# Patient Record
Sex: Male | Born: 1935 | Race: White | Hispanic: No | Marital: Married | Smoking: Former smoker
Health system: Southern US, Community
[De-identification: ages and names within clinical notes are randomized; demographics above are authoritative.]

## PROBLEM LIST (undated history)

## (undated) DIAGNOSIS — I499 Cardiac arrhythmia, unspecified: Secondary | ICD-10-CM

## (undated) DIAGNOSIS — J449 Chronic obstructive pulmonary disease, unspecified: Secondary | ICD-10-CM

## (undated) DIAGNOSIS — H401123 Primary open-angle glaucoma, left eye, severe stage: Secondary | ICD-10-CM

## (undated) DIAGNOSIS — I10 Essential (primary) hypertension: Secondary | ICD-10-CM

## (undated) DIAGNOSIS — M6281 Muscle weakness (generalized): Secondary | ICD-10-CM

## (undated) DIAGNOSIS — I2510 Atherosclerotic heart disease of native coronary artery without angina pectoris: Secondary | ICD-10-CM

## (undated) DIAGNOSIS — I4891 Unspecified atrial fibrillation: Secondary | ICD-10-CM

## (undated) DIAGNOSIS — E785 Hyperlipidemia, unspecified: Secondary | ICD-10-CM

## (undated) DIAGNOSIS — R296 Repeated falls: Secondary | ICD-10-CM

## (undated) DIAGNOSIS — H5440 Blindness, one eye, unspecified eye: Secondary | ICD-10-CM

## (undated) DIAGNOSIS — S7002XA Contusion of left hip, initial encounter: Secondary | ICD-10-CM

## (undated) HISTORY — PX: CHOLECYSTECTOMY: SHX55

## (undated) HISTORY — PX: CORONARY ANGIOPLASTY WITH STENT PLACEMENT: SHX49

## (undated) HISTORY — PX: CAROTID STENT: SHX1301

## (undated) HISTORY — PX: TRANSURETHRAL RESECTION OF PROSTATE: SHX73

---

## 2009-03-01 ENCOUNTER — Other Ambulatory Visit: Payer: Self-pay | Admitting: Internal Medicine

## 2015-05-03 ENCOUNTER — Encounter
Admission: RE | Admit: 2015-05-03 | Discharge: 2015-05-03 | Disposition: A | Discharge status: Home / Self Care | Payer: Medicare Other | Source: Ambulatory Visit | Attending: Internal Medicine | Admitting: Internal Medicine

## 2015-05-09 ENCOUNTER — Encounter
Admission: RE | Admit: 2015-05-09 | Discharge: 2015-05-09 | Disposition: A | Discharge status: Home / Self Care | Payer: Medicare Other | Source: Ambulatory Visit | Attending: Internal Medicine | Admitting: Internal Medicine

## 2015-05-29 ENCOUNTER — Emergency Department: Payer: Medicare Other

## 2015-05-29 ENCOUNTER — Observation Stay
Admission: EM | Admit: 2015-05-29 | Discharge: 2015-06-02 | Disposition: A | Discharge status: Skilled Nursing Facility | Payer: Medicare Other | Attending: Internal Medicine | Admitting: Internal Medicine

## 2015-05-29 DIAGNOSIS — N5082 Scrotal pain: Secondary | ICD-10-CM | POA: Diagnosis not present

## 2015-05-29 DIAGNOSIS — Z9889 Other specified postprocedural states: Secondary | ICD-10-CM | POA: Diagnosis not present

## 2015-05-29 DIAGNOSIS — R0602 Shortness of breath: Secondary | ICD-10-CM | POA: Insufficient documentation

## 2015-05-29 DIAGNOSIS — Z91041 Radiographic dye allergy status: Secondary | ICD-10-CM | POA: Diagnosis not present

## 2015-05-29 DIAGNOSIS — J90 Pleural effusion, not elsewhere classified: Secondary | ICD-10-CM | POA: Diagnosis not present

## 2015-05-29 DIAGNOSIS — J984 Other disorders of lung: Secondary | ICD-10-CM | POA: Insufficient documentation

## 2015-05-29 DIAGNOSIS — M25551 Pain in right hip: Secondary | ICD-10-CM | POA: Insufficient documentation

## 2015-05-29 DIAGNOSIS — K5909 Other constipation: Secondary | ICD-10-CM | POA: Insufficient documentation

## 2015-05-29 DIAGNOSIS — Z79899 Other long term (current) drug therapy: Secondary | ICD-10-CM | POA: Insufficient documentation

## 2015-05-29 DIAGNOSIS — I2510 Atherosclerotic heart disease of native coronary artery without angina pectoris: Secondary | ICD-10-CM | POA: Diagnosis not present

## 2015-05-29 DIAGNOSIS — Z7982 Long term (current) use of aspirin: Secondary | ICD-10-CM | POA: Insufficient documentation

## 2015-05-29 DIAGNOSIS — Z66 Do not resuscitate: Secondary | ICD-10-CM | POA: Diagnosis not present

## 2015-05-29 DIAGNOSIS — Z888 Allergy status to other drugs, medicaments and biological substances status: Secondary | ICD-10-CM | POA: Insufficient documentation

## 2015-05-29 DIAGNOSIS — J810 Acute pulmonary edema: Secondary | ICD-10-CM | POA: Diagnosis not present

## 2015-05-29 DIAGNOSIS — R161 Splenomegaly, not elsewhere classified: Secondary | ICD-10-CM | POA: Diagnosis not present

## 2015-05-29 DIAGNOSIS — I160 Hypertensive urgency: Secondary | ICD-10-CM | POA: Diagnosis present

## 2015-05-29 DIAGNOSIS — Z87891 Personal history of nicotine dependence: Secondary | ICD-10-CM | POA: Diagnosis not present

## 2015-05-29 DIAGNOSIS — M549 Dorsalgia, unspecified: Secondary | ICD-10-CM | POA: Diagnosis not present

## 2015-05-29 DIAGNOSIS — Z880 Allergy status to penicillin: Secondary | ICD-10-CM | POA: Insufficient documentation

## 2015-05-29 DIAGNOSIS — Z79891 Long term (current) use of opiate analgesic: Secondary | ICD-10-CM | POA: Diagnosis not present

## 2015-05-29 DIAGNOSIS — Z9049 Acquired absence of other specified parts of digestive tract: Secondary | ICD-10-CM | POA: Diagnosis not present

## 2015-05-29 DIAGNOSIS — I119 Hypertensive heart disease without heart failure: Secondary | ICD-10-CM | POA: Insufficient documentation

## 2015-05-29 DIAGNOSIS — Z885 Allergy status to narcotic agent status: Secondary | ICD-10-CM | POA: Diagnosis not present

## 2015-05-29 DIAGNOSIS — W19XXXA Unspecified fall, initial encounter: Secondary | ICD-10-CM | POA: Insufficient documentation

## 2015-05-29 DIAGNOSIS — Z8249 Family history of ischemic heart disease and other diseases of the circulatory system: Secondary | ICD-10-CM | POA: Diagnosis not present

## 2015-05-29 DIAGNOSIS — I517 Cardiomegaly: Secondary | ICD-10-CM | POA: Insufficient documentation

## 2015-05-29 DIAGNOSIS — G8929 Other chronic pain: Secondary | ICD-10-CM | POA: Insufficient documentation

## 2015-05-29 DIAGNOSIS — H5442 Blindness, left eye, normal vision right eye: Secondary | ICD-10-CM | POA: Insufficient documentation

## 2015-05-29 DIAGNOSIS — R918 Other nonspecific abnormal finding of lung field: Secondary | ICD-10-CM | POA: Diagnosis not present

## 2015-05-29 DIAGNOSIS — R0682 Tachypnea, not elsewhere classified: Secondary | ICD-10-CM | POA: Insufficient documentation

## 2015-05-29 DIAGNOSIS — E876 Hypokalemia: Secondary | ICD-10-CM | POA: Diagnosis not present

## 2015-05-29 DIAGNOSIS — R911 Solitary pulmonary nodule: Secondary | ICD-10-CM | POA: Diagnosis not present

## 2015-05-29 DIAGNOSIS — J449 Chronic obstructive pulmonary disease, unspecified: Secondary | ICD-10-CM | POA: Insufficient documentation

## 2015-05-29 DIAGNOSIS — Z955 Presence of coronary angioplasty implant and graft: Secondary | ICD-10-CM | POA: Diagnosis not present

## 2015-05-29 DIAGNOSIS — E785 Hyperlipidemia, unspecified: Secondary | ICD-10-CM | POA: Diagnosis not present

## 2015-05-29 DIAGNOSIS — R079 Chest pain, unspecified: Secondary | ICD-10-CM | POA: Diagnosis present

## 2015-05-29 DIAGNOSIS — H4010X3 Unspecified open-angle glaucoma, severe stage: Secondary | ICD-10-CM | POA: Insufficient documentation

## 2015-05-29 DIAGNOSIS — I482 Chronic atrial fibrillation: Secondary | ICD-10-CM | POA: Insufficient documentation

## 2015-05-29 HISTORY — DX: Repeated falls: R29.6

## 2015-05-29 HISTORY — DX: Primary open-angle glaucoma, left eye, severe stage: H40.1123

## 2015-05-29 HISTORY — DX: Contusion of left hip, initial encounter: S70.02XA

## 2015-05-29 HISTORY — DX: Unspecified atrial fibrillation: I48.91

## 2015-05-29 HISTORY — DX: Essential (primary) hypertension: I10

## 2015-05-29 HISTORY — DX: Hyperlipidemia, unspecified: E78.5

## 2015-05-29 HISTORY — DX: Blindness, one eye, unspecified eye: H54.40

## 2015-05-29 HISTORY — DX: Muscle weakness (generalized): M62.81

## 2015-05-29 HISTORY — DX: Atherosclerotic heart disease of native coronary artery without angina pectoris: I25.10

## 2015-05-29 HISTORY — DX: Chronic obstructive pulmonary disease, unspecified: J44.9

## 2015-05-29 LAB — CBC WITH DIFFERENTIAL/PLATELET
Basophils Absolute: 0.1 10*3/uL (ref 0–0.1)
Basophils Relative: 1 %
EOS ABS: 0.3 10*3/uL (ref 0–0.7)
EOS PCT: 3 %
HEMATOCRIT: 43.4 % (ref 40.0–52.0)
Hemoglobin: 13.9 g/dL (ref 13.0–18.0)
LYMPHS ABS: 1.5 10*3/uL (ref 1.0–3.6)
LYMPHS PCT: 16 %
MCH: 30.8 pg (ref 26.0–34.0)
MCHC: 32 g/dL (ref 32.0–36.0)
MCV: 96.5 fL (ref 80.0–100.0)
MONO ABS: 1.3 10*3/uL — AB (ref 0.2–1.0)
Monocytes Relative: 14 %
Neutro Abs: 6 10*3/uL (ref 1.4–6.5)
Neutrophils Relative %: 66 %
Platelets: 190 10*3/uL (ref 150–440)
RBC: 4.5 MIL/uL (ref 4.40–5.90)
RDW: 18.4 % — ABNORMAL HIGH (ref 11.5–14.5)
WBC: 9.1 10*3/uL (ref 3.8–10.6)

## 2015-05-29 LAB — BASIC METABOLIC PANEL
ANION GAP: 4 — AB (ref 5–15)
BUN: 26 mg/dL — ABNORMAL HIGH (ref 6–20)
CHLORIDE: 108 mmol/L (ref 101–111)
CO2: 30 mmol/L (ref 22–32)
Calcium: 10.1 mg/dL (ref 8.9–10.3)
Creatinine, Ser: 0.72 mg/dL (ref 0.61–1.24)
GFR calc non Af Amer: >60 mL/min (ref >60)
Glucose, Bld: 105 mg/dL — ABNORMAL HIGH (ref 65–99)
POTASSIUM: 4.1 mmol/L (ref 3.5–5.1)
Sodium: 142 mmol/L (ref 135–145)

## 2015-05-29 LAB — TROPONIN I

## 2015-05-29 LAB — BRAIN NATRIURETIC PEPTIDE: B NATRIURETIC PEPTIDE 5: 487 pg/mL — AB (ref 0.0–100.0)

## 2015-05-29 MED ORDER — NITROGLYCERIN 0.4 MG SL SUBL
0.4000 mg | SUBLINGUAL_TABLET | SUBLINGUAL | Status: DC | PRN
Start: 2015-05-29 — End: 2015-06-02
  Administered 2015-05-29 – 2015-06-01 (×2): 0.4 mg via SUBLINGUAL
  Filled 2015-05-29 (×2): qty 1

## 2015-05-29 MED ORDER — LABETALOL HCL 5 MG/ML IV SOLN: 20.0000 mg | Freq: Once | INTRAVENOUS | Status: DC

## 2015-05-29 MED ORDER — METOPROLOL TARTRATE 1 MG/ML IV SOLN
2.5000 mg | Freq: Once | INTRAVENOUS | Status: DC
  Filled 2015-05-29: qty 5

## 2015-05-29 MED ORDER — ASPIRIN 81 MG PO CHEW
324.0000 mg | CHEWABLE_TABLET | Freq: Once | ORAL | Status: AC
  Administered 2015-05-29: 324 mg via ORAL
  Filled 2015-05-29: qty 4

## 2015-05-29 NOTE — ED Notes (Signed)
Pt arrived to ED via [facility] from [facility] for c/o HTN. Per EMS, pt had c/o earlier this evening of SHOB and CP, but resolved after a breathing treatment. EMS reports being called out by staff for reports of HTN. EMS reports BP of 230/110 en route. Pt denies any c/o pain, N/V, or SHOB at this time.

## 2015-05-29 NOTE — ED Provider Notes (Signed)
[facility] Emergency Department Provider Note  ____________________________________________  Time seen: Seen upon arrival to the emergency department  I have reviewed the triage vital signs and the nursing notes.   HISTORY  Chief Complaint Hypertension    HPI Donnelly Legall is a 81 y.o. male with a history of hypertension, A. fib and COPD who is presenting today with high blood pressure. The patient denies any chest pain or shortness of breath at this time. However, per EMS, patient was complaining of shortness of breath and chest pain earlier in the evening. He was given a nebulizer treatment and his symptoms cleared. Per EMS there wheezing but otherwise the lung sounds were clear. The patient does not recall if he has taken his evening medications. Denies any fever, cough.   Past Medical History  Diagnosis Date  . Atrial fibrillation, unspecified   . Contusion of left hip   . Recurrent falls   . Muscle weakness (generalized)   . Atherosclerotic heart disease of native coronary artery without angina pectoris   . Hypertension   . Primary open angle glaucoma of left eye, severe stage   . Blindness of left eye   . COPD (chronic obstructive pulmonary disease) ([place])   . Hyperlipidemia     There are no active problems to display for this patient.   Past Surgical History  Procedure Laterality Date  . Cholecystectomy    . Transurethral resection of prostate    . Coronary angioplasty with stent placement    . Carotid stent      Current Outpatient Rx  Name  Route  Sig  Dispense  Refill  . aspirin EC 81 MG tablet   Oral   Take 81 mg by mouth daily.         . bimatoprost (LUMIGAN) 0.01 % SOLN   Both Eyes   Place 1 drop into both eyes at bedtime.         . brimonidine (ALPHAGAN) 0.2 % ophthalmic solution   Right Eye   Place 1 drop into the right eye 3 (three) times daily.         . cholecalciferol (VITAMIN D) 1000 UNITS tablet   Oral   Take  1,000 Units by mouth daily.         . cloNIDine (CATAPRES) 0.1 MG tablet   Oral   Take 0.1 mg by mouth once.         . dorzolamide-timolol (COSOPT) 22.3-6.8 MG/ML ophthalmic solution   Right Eye   Place 1 drop into the right eye 2 (two) times daily.         Marland Kitchen HYDROcodone-acetaminophen (NORCO/VICODIN) 5-325 MG tablet   Oral   Take 1 tablet by mouth every 4 (four) hours as needed for moderate pain.         Marland Kitchen lactulose (CHRONULAC) 10 GM/15ML solution   Oral   Take 20 g by mouth 2 (two) times daily.         Marland Kitchen lidocaine (LIDODERM) 5 %   Transdermal   Place 1 patch onto the skin daily. Remove & Discard patch within 12 hours or as directed by MD         . lisinopril (PRINIVIL,ZESTRIL) 20 MG tablet   Oral   Take 20 mg by mouth daily.         . Menthol-Methyl Salicylate (THERA-GESIC) 1-15 % CREA   Apply externally   Apply 1 application topically 4 (four) times daily as needed (for pain).         Marland Kitchen  metoprolol succinate (TOPROL-XL) 100 MG 24 hr tablet   Oral   Take 100 mg by mouth daily.         . simvastatin (ZOCOR) 20 MG tablet   Oral   Take 20 mg by mouth at bedtime.           Allergies Adenosine; Hydrochlorothiazide; Ivp dye; Penicillins; and Tramadol  History reviewed. No pertinent family history.  Social History Social History  Substance Use Topics  . Smoking status: Former Research scientist (life sciences)  . Smokeless tobacco: None  . Alcohol Use: 0.6 oz/week    1 Cans of beer per week    Review of Systems Constitutional: No fever/chills Eyes: No visual changes. ENT: No sore throat. Cardiovascular: Denies chest pain. Respiratory: Denies shortness of breath. Gastrointestinal: No abdominal pain.  No nausea, no vomiting.  No diarrhea.  No constipation. Genitourinary: Negative for dysuria. Musculoskeletal: Negative for back pain. Skin: Negative for rash. Neurological: Negative for headaches, focal weakness or numbness.  10-point ROS otherwise  negative.  ____________________________________________   PHYSICAL EXAM:  VITAL SIGNS: ED Triage Vitals  Enc Vitals Group     BP 05/29/15 2138 191/127 mmHg     Pulse Rate 05/29/15 2138 81     Resp --      Temp 05/29/15 2138 97.3 F (36.3 C)     Temp Source 05/29/15 2138 Oral     SpO2 05/29/15 2138 95 %     Weight 05/29/15 2138 172 lb 12.8 oz (78.382 kg)     Height 05/29/15 2138 5\' 10"  (1.778 m)     Head Cir --      Peak Flow --      Pain Score 05/29/15 2140 0     Pain Loc --      Pain Edu? --      Excl. in [place]? --     Constitutional: Alert and oriented. Well appearing and in no acute distress. Eyes: Conjunctivae are normal. PERRL. EOMI. Head: Atraumatic. Nose: No congestion/rhinnorhea. Mouth/Throat: Mucous membranes are moist.  Oropharynx non-erythematous. Neck: No stridor.   Cardiovascular: Normal rate, regular rhythm. Grossly normal heart sounds.  Good peripheral circulation. Respiratory: Mildly increased respiratory effort with mild tachypnea. No retractions. Lungs CTAB. Gastrointestinal: Soft and nontender. No distention. No abdominal bruits. No CVA tenderness. Musculoskeletal: No lower extremity tenderness. Mild edema to the bilateral lower extremities.  No joint effusions. Neurologic:  Normal speech and language. No gross focal neurologic deficits are appreciated. No gait instability. Skin:  Skin is warm, dry and intact. No rash noted. Psychiatric: Mood and affect are normal. Speech and behavior are normal.  ____________________________________________   LABS (all labs ordered are listed, but only abnormal results are displayed)  Labs Reviewed  CBC WITH DIFFERENTIAL/PLATELET - Abnormal; Notable for the following:    RDW 18.4 (*)    Monocytes Absolute 1.3 (*)    All other components within normal limits  BASIC METABOLIC PANEL - Abnormal; Notable for the following:    Glucose, Bld 105 (*)    BUN 26 (*)    Anion gap 4 (*)    All other components within normal  limits  BRAIN NATRIURETIC PEPTIDE - Abnormal; Notable for the following:    B Natriuretic Peptide 487.0 (*)    All other components within normal limits  TROPONIN I   ____________________________________________  EKG  ED ECG REPORT I, Doran Stabler, the attending physician, personally viewed and interpreted this ECG.   Date: 05/29/2015  EKG Time: 2142  Rate:  102  Rhythm: atrial fibrillation, rate 102  Axis: Normal axis  Intervals:none  ST&T Change: No ST segment elevation or depression. No abnormal T-wave inversion.  ____________________________________________  RADIOLOGY  2.3 x 1.67 and a right upper lobe nodule concerning for lung cancer. ____________________________________________   PROCEDURES    ____________________________________________   INITIAL IMPRESSION / ASSESSMENT AND PLAN / ED COURSE  Pertinent labs & imaging results that were available during my care of the patient were reviewed by me and considered in my medical decision making (see chart for details).  ----------------------------------------- 11:15 PM on 05/29/2015 -----------------------------------------  Patient resting comfortable now and respiratory status improved after nitroglycerin. I will admit the patient to the hospital secondary to his chest pain with hypertension. Explained this to the patient as well as the family who understand the plan and are willing to comply. They're also aware of the possible mass on the chest x-ray. We will proceed with CAT scan. ____________________________________________   FINAL CLINICAL IMPRESSION(S) / ED DIAGNOSES  Hypertensive urgency.    Orbie Pyo, MD 05/29/15 [id number]

## 2015-05-29 NOTE — ED Notes (Signed)
Patient transported from CT to ED [id number].

## 2015-05-30 ENCOUNTER — Observation Stay: Payer: Medicare Other

## 2015-05-30 ENCOUNTER — Encounter: Payer: Self-pay | Admitting: Internal Medicine

## 2015-05-30 DIAGNOSIS — I160 Hypertensive urgency: Secondary | ICD-10-CM | POA: Diagnosis not present

## 2015-05-30 DIAGNOSIS — R079 Chest pain, unspecified: Secondary | ICD-10-CM | POA: Diagnosis present

## 2015-05-30 LAB — PROTEIN, BODY FLUID: Total protein, fluid: <3 g/dL

## 2015-05-30 LAB — URINALYSIS COMPLETE WITH MICROSCOPIC (ARMC ONLY)
BACTERIA UA: NONE SEEN
Bilirubin Urine: NEGATIVE
Glucose, UA: NEGATIVE mg/dL
Hgb urine dipstick: NEGATIVE
KETONES UR: NEGATIVE mg/dL
Leukocytes, UA: NEGATIVE
NITRITE: NEGATIVE
PH: 5 (ref 5.0–8.0)
PROTEIN: 30 mg/dL — AB
Specific Gravity, Urine: 1.019 (ref 1.005–1.030)

## 2015-05-30 LAB — TSH: TSH: 1.035 u[IU]/mL (ref 0.350–4.500)

## 2015-05-30 LAB — TROPONIN I
Troponin I: <0.03 ng/mL (ref <0.031)
Troponin I: <0.03 ng/mL (ref <0.031)

## 2015-05-30 LAB — LACTATE DEHYDROGENASE, PLEURAL OR PERITONEAL FLUID: LD FL: 102 U/L — AB (ref 3–23)

## 2015-05-30 LAB — AMYLASE: Amylase: 44 U/L (ref 28–100)

## 2015-05-30 LAB — HEMOGLOBIN A1C: Hgb A1c MFr Bld: 4.8 % (ref 4.0–6.0)

## 2015-05-30 MED ORDER — SIMVASTATIN 20 MG PO TABS
20.0000 mg | ORAL_TABLET | Freq: Every day | ORAL | Status: DC
  Administered 2015-05-30 – 2015-05-31 (×2): 20 mg via ORAL
  Filled 2015-05-30 (×3): qty 1

## 2015-05-30 MED ORDER — ACETAMINOPHEN 650 MG RE SUPP: 650.0000 mg | Freq: Four times a day (QID) | RECTAL | Status: DC | PRN

## 2015-05-30 MED ORDER — LIDOCAINE 5 % EX PTCH
1.0000 | MEDICATED_PATCH | CUTANEOUS | Status: DC
  Administered 2015-05-30 – 2015-06-02 (×4): 1 via TRANSDERMAL
  Filled 2015-05-30 (×5): qty 1

## 2015-05-30 MED ORDER — SODIUM CHLORIDE 0.9 % IJ SOLN: 3.0000 mL | Freq: Two times a day (BID) | INTRAMUSCULAR | Status: DC

## 2015-05-30 MED ORDER — ACETAMINOPHEN 325 MG PO TABS: 650.0000 mg | ORAL_TABLET | Freq: Four times a day (QID) | ORAL | Status: DC | PRN

## 2015-05-30 MED ORDER — VITAMIN D 1000 UNITS PO TABS
1000.0000 [IU] | ORAL_TABLET | Freq: Every day | ORAL | Status: DC
  Administered 2015-05-30 – 2015-06-02 (×4): 1000 [IU] via ORAL
  Filled 2015-05-30 (×4): qty 1

## 2015-05-30 MED ORDER — ASPIRIN EC 81 MG PO TBEC
81.0000 mg | DELAYED_RELEASE_TABLET | Freq: Every day | ORAL | Status: DC
  Administered 2015-05-30 – 2015-06-02 (×4): 81 mg via ORAL
  Filled 2015-05-30 (×4): qty 1

## 2015-05-30 MED ORDER — MORPHINE SULFATE (PF) 2 MG/ML IV SOLN: 2.0000 mg | INTRAVENOUS | Status: DC | PRN

## 2015-05-30 MED ORDER — FUROSEMIDE 10 MG/ML IJ SOLN
40.0000 mg | Freq: Once | INTRAMUSCULAR | Status: AC
  Administered 2015-05-30: 40 mg via INTRAVENOUS
  Filled 2015-05-30: qty 4

## 2015-05-30 MED ORDER — METOPROLOL SUCCINATE ER 100 MG PO TB24
100.0000 mg | ORAL_TABLET | Freq: Every day | ORAL | Status: DC
  Administered 2015-05-30 – 2015-06-02 (×4): 100 mg via ORAL
  Filled 2015-05-30 (×4): qty 1

## 2015-05-30 MED ORDER — HEPARIN SODIUM (PORCINE) 5000 UNIT/ML IJ SOLN
5000.0000 [IU] | Freq: Three times a day (TID) | INTRAMUSCULAR | Status: DC
  Administered 2015-05-30: 5000 [IU] via SUBCUTANEOUS
  Filled 2015-05-30: qty 1

## 2015-05-30 MED ORDER — CLONIDINE HCL 0.1 MG PO TABS
0.1000 mg | ORAL_TABLET | Freq: Once | ORAL | Status: AC
  Administered 2015-05-30: 0.1 mg via ORAL
  Filled 2015-05-30: qty 1

## 2015-05-30 MED ORDER — DORZOLAMIDE HCL-TIMOLOL MAL 2-0.5 % OP SOLN
1.0000 [drp] | Freq: Two times a day (BID) | OPHTHALMIC | Status: DC
  Administered 2015-05-30 – 2015-06-02 (×6): 1 [drp] via OPHTHALMIC
  Filled 2015-05-30: qty 10

## 2015-05-30 MED ORDER — BRIMONIDINE TARTRATE 0.2 % OP SOLN
1.0000 [drp] | Freq: Three times a day (TID) | OPHTHALMIC | Status: DC
  Administered 2015-05-30 – 2015-06-02 (×8): 1 [drp] via OPHTHALMIC
  Filled 2015-05-30: qty 5

## 2015-05-30 MED ORDER — MUSCLE RUB 10-15 % EX CREA
1.0000 "application " | TOPICAL_CREAM | Freq: Four times a day (QID) | CUTANEOUS | Status: DC | PRN
  Filled 2015-05-30: qty 85

## 2015-05-30 MED ORDER — ISOSORBIDE MONONITRATE ER 30 MG PO TB24
30.0000 mg | ORAL_TABLET | Freq: Every day | ORAL | Status: DC
  Administered 2015-05-30 – 2015-06-02 (×5): 30 mg via ORAL
  Filled 2015-05-30 (×6): qty 1

## 2015-05-30 MED ORDER — ONDANSETRON HCL 4 MG PO TABS: 4.0000 mg | ORAL_TABLET | Freq: Four times a day (QID) | ORAL | Status: DC | PRN

## 2015-05-30 MED ORDER — DILTIAZEM HCL 25 MG/5ML IV SOLN
10.0000 mg | Freq: Once | INTRAVENOUS | Status: AC
  Administered 2015-05-30: 10 mg via INTRAVENOUS
  Filled 2015-05-30: qty 5

## 2015-05-30 MED ORDER — LACTULOSE 10 GM/15ML PO SOLN
20.0000 g | Freq: Two times a day (BID) | ORAL | Status: DC
  Administered 2015-05-30 – 2015-06-02 (×6): 20 g via ORAL
  Filled 2015-05-30 (×7): qty 30

## 2015-05-30 MED ORDER — LATANOPROST 0.005 % OP SOLN
1.0000 [drp] | Freq: Every day | OPHTHALMIC | Status: DC
Start: 2015-05-30 — End: 2015-06-02
  Administered 2015-05-30 – 2015-05-31 (×2): 1 [drp] via OPHTHALMIC
  Filled 2015-05-30: qty 2.5

## 2015-05-30 MED ORDER — LISINOPRIL 20 MG PO TABS
20.0000 mg | ORAL_TABLET | Freq: Every day | ORAL | Status: DC
  Administered 2015-05-30 – 2015-06-02 (×4): 20 mg via ORAL
  Filled 2015-05-30 (×3): qty 1

## 2015-05-30 MED ORDER — SODIUM CHLORIDE 0.9 % IJ SOLN: 3.0000 mL | INTRAMUSCULAR | Status: DC | PRN

## 2015-05-30 MED ORDER — ONDANSETRON HCL 4 MG/2ML IJ SOLN: 4.0000 mg | Freq: Four times a day (QID) | INTRAMUSCULAR | Status: DC | PRN

## 2015-05-30 MED ORDER — LISINOPRIL 20 MG PO TABS
20.0000 mg | ORAL_TABLET | Freq: Every day | ORAL | Status: DC
Start: 2015-05-30 — End: 2015-05-30

## 2015-05-30 MED ORDER — DOCUSATE SODIUM 100 MG PO CAPS
100.0000 mg | ORAL_CAPSULE | Freq: Two times a day (BID) | ORAL | Status: DC
  Administered 2015-05-30 – 2015-06-02 (×6): 100 mg via ORAL
  Filled 2015-05-30 (×7): qty 1

## 2015-05-30 MED ORDER — LISINOPRIL 20 MG PO TABS
10.0000 mg | ORAL_TABLET | Freq: Once | ORAL | Status: AC
Start: 2015-05-30 — End: 2015-05-30
  Administered 2015-05-30: 10 mg via ORAL
  Filled 2015-05-30: qty 1

## 2015-05-30 NOTE — Procedures (Signed)
R thora 1000 cc CXR pending

## 2015-05-30 NOTE — Consult Note (Signed)
[facility] Cardiology  CARDIOLOGY CONSULT NOTE  Patient ID: Mason Allen MRN: [id number] DOB/AGE: Jan 08, 1921 81 y.o.  Admit date: 05/29/2015 Referring Physician Volanda Napoleon Primary Physician [facility] Primary Cardiologist Milind Raether Reason for Consultation hypertension  HPI: 71 year old gentleman referred for evaluation of elevated blood pressure, shortness of breath and atrial fibrillation. The patient has known coronary artery disease, status post stent RCA 04/21/06, stent mid LAD 08/23/06. The patient has a history of paroxysmal atrial fibrillation and essential hypertension. He presents to [facility] emergency room with progressive exertional dyspnea. Chest x-ray revealed right upper lobe lung nodule with bilateral pleural effusions. She was noted be hypertensive with systolic blood pressures greater than 190. The patient denies chest pain. EKG was nondiagnostic. Troponin is negative. Thoracentesis pending this afternoon.  Review of systems complete and found to be negative unless listed above     Past Medical History  Diagnosis Date  . Atrial fibrillation, unspecified   . Contusion of left hip   . Recurrent falls   . Muscle weakness (generalized)   . Atherosclerotic heart disease of native coronary artery without angina pectoris   . Hypertension   . Primary open angle glaucoma of left eye, severe stage   . Blindness of left eye   . COPD (chronic obstructive pulmonary disease) ([place])   . Hyperlipidemia     Past Surgical History  Procedure Laterality Date  . Cholecystectomy    . Transurethral resection of prostate    . Coronary angioplasty with stent placement    . Carotid stent      Prescriptions prior to admission  Medication Sig Dispense Refill Last Dose  . aspirin EC 81 MG tablet Take 81 mg by mouth daily.   unknown at unknown  . bimatoprost (LUMIGAN) 0.01 % SOLN Place 1 drop into both eyes at bedtime.   unknown at unknown  . brimonidine (ALPHAGAN) 0.2 % ophthalmic solution Place 1 drop  into the right eye 3 (three) times daily.   unknown at unknown  . cholecalciferol (VITAMIN D) 1000 UNITS tablet Take 1,000 Units by mouth daily.   unknown at unknown  . cloNIDine (CATAPRES) 0.1 MG tablet Take 0.1 mg by mouth once.   05/29/2015 at 1230  . dorzolamide-timolol (COSOPT) 22.3-6.8 MG/ML ophthalmic solution Place 1 drop into the right eye 2 (two) times daily.   unknown at unknown  . HYDROcodone-acetaminophen (NORCO/VICODIN) 5-325 MG tablet Take 1 tablet by mouth every 4 (four) hours as needed for moderate pain.   PRN at PRN  . lactulose (CHRONULAC) 10 GM/15ML solution Take 20 g by mouth 2 (two) times daily.   unknown at unknown  . lidocaine (LIDODERM) 5 % Place 1 patch onto the skin daily. Remove & Discard patch within 12 hours or as directed by MD   unknown at unknown  . lisinopril (PRINIVIL,ZESTRIL) 20 MG tablet Take 20 mg by mouth daily.   unknown at unknown  . Menthol-Methyl Salicylate (THERA-GESIC) 1-15 % CREA Apply 1 application topically 4 (four) times daily as needed (for pain).   PRN at PRN  . metoprolol succinate (TOPROL-XL) 100 MG 24 hr tablet Take 100 mg by mouth daily.   unknown at unknown  . simvastatin (ZOCOR) 20 MG tablet Take 20 mg by mouth at bedtime.   unknown at unknown   Social History   Social History  . Marital Status: Married    Spouse Name: N/A  . Number of Children: N/A  . Years of Education: N/A   Occupational History  . Not on file.  Social History Main Topics  . Smoking status: Former Research scientist (life sciences)  . Smokeless tobacco: Not on file  . Alcohol Use: 0.6 oz/week    1 Cans of beer per week  . Drug Use: Not on file  . Sexual Activity: Not on file   Other Topics Concern  . Not on file   Social History Narrative    Family History  Problem Relation Age of Onset  . Hypertension        Review of systems complete and found to be negative unless listed above      PHYSICAL EXAM  General: Well developed, well nourished, in no acute distress HEENT:   Normocephalic and atramatic Neck:  No JVD.  Lungs: Clear bilaterally to auscultation and percussion. Heart: HRRR . Normal S1 and S2 without gallops or murmurs.  Abdomen: Bowel sounds are positive, abdomen soft and non-tender  Msk:  Back normal, normal gait. Normal strength and tone for age. Extremities: No clubbing, cyanosis or edema.   Neuro: Alert and oriented X 3. Psych:  Good affect, responds appropriately  Labs:   Lab Results  Component Value Date   WBC 9.1 05/29/2015   HGB 13.9 05/29/2015   HCT 43.4 05/29/2015   MCV 96.5 05/29/2015   PLT 190 05/29/2015    Recent Labs Lab 05/29/15 2152  NA 142  K 4.1  CL 108  CO2 30  BUN 26*  CREATININE 0.72  CALCIUM 10.1  GLUCOSE 105*   Lab Results  Component Value Date   TROPONINI <0.03 05/30/2015   No results found for: CHOL No results found for: HDL No results found for: LDLCALC No results found for: TRIG No results found for: CHOLHDL No results found for: LDLDIRECT    Radiology: Dg Chest 1 View  05/29/2015  CLINICAL DATA:  Shortness of breath. Chest pain. Hypertension. Symptoms occurred tonight. EXAM: CHEST 1 VIEW COMPARISON:  None. FINDINGS: Atherosclerotic aortic arch. Borderline enlargement of the cardiopericardial silhouette mild obscuration of the left hemidiaphragm medially in the retrocardiac region. There is some calcifications in the right mid lung and right hilum indicative of old granulomatous disease. However, there is also a somewhat irregular 2.3 by 1.6 cm right upper lobe nodule which is not definitively calcified. The Mild upper zone pulmonary vascular prominence. IMPRESSION: 1. 2.3 by 1.6 cm right upper lobe nodule. The possibility of lung cancer is raised. Chest CT is recommended for further workup. If the patient's creatinine allows, pulmonary embolus protocol chest CTA could be utilized but to workup this nodule and rule out underlying vascular abnormalities. 2. Native retrocardiac density possibly from  atelectasis or pneumonia. 3. Old granulomatous disease 4. Atherosclerotic aortic arch. Electronically Signed   By: Van Clines M.D.   On: 05/29/2015 22:11   Ct Chest Wo Contrast  05/29/2015  CLINICAL DATA:  Acute onset of shortness of breath and generalized chest pain. Wheezing. High blood pressure. Initial encounter. EXAM: CT CHEST WITHOUT CONTRAST TECHNIQUE: Multidetector CT imaging of the chest was performed following the standard protocol without IV contrast. COMPARISON:  Chest radiograph performed earlier today at 9:50 p.m. FINDINGS: Small to moderate bilateral pleural effusions are noted, slightly larger on the right. Scattered calcified granulomata are noted within the collapsed portions of the right lower lobe. A 2.1 cm nodule is noted near the right lung apex, concerning for primary malignancy. A prominent calcified granuloma is noted within the right middle lobe. No pneumothorax is seen. Trace pericardial fluid remains within normal limits. Diffuse coronary artery calcifications are seen.  Diffuse calcification is noted along the aortic arch and proximal great vessels. The thyroid gland is unremarkable in appearance. No axillary lymphadenopathy is seen. Small bilateral hypodensities in the liver are nonspecific but may reflect small cysts. Numerous calcified granulomata are seen within the spleen. Diffuse calcification is noted along the proximal abdominal aorta and its branches. There appears to be a 3.3 cm solid mass inferior to the splenic hilum, which may reflect a renal mass, though the left kidney is not imaged on this study. No acute osseous abnormalities are seen. IMPRESSION: 1. Small to moderate bilateral pleural effusions, slightly larger on the right. Underlying scattered calcified granulomata within the collapsed portions of the right lower lobe. 2. 2.1 cm nodule near the right lung apex, concerning for primary bronchogenic malignancy. 3. Apparent 3.3 cm solid mass inferior to the  splenic hilum. The left kidney is not imaged on this study, but this may reflect a renal mass. CT of the abdomen and pelvis could be considered for further evaluation, on an elective nonemergent basis, if deemed clinically appropriate. 4. Diffuse coronary artery calcifications seen. 5. Scattered small bilateral hypodensities within the liver are nonspecific but may reflect small cysts. 6. Diffuse calcification along the proximal abdominal aorta and its branches. Electronically Signed   By: Garald Balding M.D.   On: 05/29/2015 23:57    EKG: Normal sinus rhythm  ASSESSMENT AND PLAN:   1. Shortness of breath, likely multifactorial, no COPD, bilateral pleural effusions, centesis pending 2. Hypertensive urgency, blood pressure improved after intravenous diltiazem, currently clinically stable, may benefit from the addition of diuretic to blood pressure regimen 3. Known CAD, currently without chest pain, negative troponin 4. Chronic atrial fibrillation, chads Vasc of 4, currently on aspirin with recent history of hematoma  Recommendations  1. Agree with overall current therapy 2. Add low-dose furosemide to blood pressure regimen 3. Continue aspirin for stroke prevention 4. Defer further cardiac diagnostics at this time   Signed: Mariaceleste Herrera MD,PhD, [facility] 05/30/2015, 1:19 PM

## 2015-05-30 NOTE — ED Notes (Signed)
MD at bedside. 

## 2015-05-30 NOTE — H&P (Signed)
Mason Allen is an 81 y.o. male.   Chief Complaint: Shortness of breath HPI: The patient presents emergency department via EMS after experiencing rapidly worsening shortness of breath. The patient states that he had been mildly dyspneic the previous 2 days but got to the point that he could not leave today. He also admits to some chest pain while he was acutely short of breath. His chest pain resolved after receiving aspirin and nitroglycerin in the emergency department, however he continued to breathe rapidly. Chest x-ray shows bilateral pleural effusions as well as a right upper lobe lung nodule. Systolic blood pressure was found to be greater than 190. History of falls and recently had his lisinopril dose decreased. CT scan of the abdomen shows a splenic mass as well. Of note, the patient also complains of left scrotal pain. He underwent an ultrasound of his scrotum yesterday which his wife reports shows hydrocele without inguinal hernia. Past medical history significant for coronary artery disease status post PCI, hypertension and transurethral resection of the prostate. The emergency department called for admission due to uncontrolled blood pressure and tachypnea.  Past Medical History  Diagnosis Date  . Atrial fibrillation, unspecified   . Contusion of left hip   . Recurrent falls   . Muscle weakness (generalized)   . Atherosclerotic heart disease of native coronary artery without angina pectoris   . Hypertension   . Primary open angle glaucoma of left eye, severe stage   . Blindness of left eye   . COPD (chronic obstructive pulmonary disease) ([place])   . Hyperlipidemia     Past Surgical History  Procedure Laterality Date  . Cholecystectomy    . Transurethral resection of prostate    . Coronary angioplasty with stent placement    . Carotid stent      Family History  Problem Relation Age of Onset  . Hypertension     Social History:  reports that he has quit smoking. He does not have  any smokeless tobacco history on file. He reports that he drinks about 0.6 oz of alcohol per week. His drug history is not on file.  Allergies:  Allergies  Allergen Reactions  . Adenosine Other (See Comments)    Reaction:  Unknown   . Hydrochlorothiazide Other (See Comments)    Reaction:  Unknown   . Ivp Dye [Iodinated Diagnostic Agents] Other (See Comments)    Reaction:  Unknown   . Penicillins Other (See Comments)    Reaction:  Unknown   . Tramadol Other (See Comments)    Reaction:  Unknown     Prior to Admission medications   Medication Sig Start Date End Date Taking? Authorizing Provider  aspirin EC 81 MG tablet Take 81 mg by mouth daily.   Yes Historical Provider, MD  bimatoprost (LUMIGAN) 0.01 % SOLN Place 1 drop into both eyes at bedtime.   Yes Historical Provider, MD  brimonidine (ALPHAGAN) 0.2 % ophthalmic solution Place 1 drop into the right eye 3 (three) times daily.   Yes Historical Provider, MD  cholecalciferol (VITAMIN D) 1000 UNITS tablet Take 1,000 Units by mouth daily.   Yes Historical Provider, MD  cloNIDine (CATAPRES) 0.1 MG tablet Take 0.1 mg by mouth once.   Yes Historical Provider, MD  dorzolamide-timolol (COSOPT) 22.3-6.8 MG/ML ophthalmic solution Place 1 drop into the right eye 2 (two) times daily.   Yes Historical Provider, MD  HYDROcodone-acetaminophen (NORCO/VICODIN) 5-325 MG tablet Take 1 tablet by mouth every 4 (four) hours as needed for  moderate pain.   Yes Historical Provider, MD  lactulose (CHRONULAC) 10 GM/15ML solution Take 20 g by mouth 2 (two) times daily.   Yes Historical Provider, MD  lidocaine (LIDODERM) 5 % Place 1 patch onto the skin daily. Remove & Discard patch within 12 hours or as directed by MD   Yes Historical Provider, MD  lisinopril (PRINIVIL,ZESTRIL) 20 MG tablet Take 20 mg by mouth daily.   Yes Historical Provider, MD  Menthol-Methyl Salicylate (THERA-GESIC) 1-15 % CREA Apply 1 application topically 4 (four) times daily as needed (for  pain).   Yes Historical Provider, MD  metoprolol succinate (TOPROL-XL) 100 MG 24 hr tablet Take 100 mg by mouth daily.   Yes Historical Provider, MD  simvastatin (ZOCOR) 20 MG tablet Take 20 mg by mouth at bedtime.   Yes Historical Provider, MD     Results for orders placed or performed during the hospital encounter of 05/29/15 (from the past 48 hour(s))  CBC with Differential     Status: Abnormal   Collection Time: 05/29/15  9:52 PM  Result Value Ref Range   WBC 9.1 3.8 - 10.6 K/uL   RBC 4.50 4.40 - 5.90 MIL/uL   Hemoglobin 13.9 13.0 - 18.0 g/dL   HCT 43.4 40.0 - 52.0 %   MCV 96.5 80.0 - 100.0 fL   MCH 30.8 26.0 - 34.0 pg   MCHC 32.0 32.0 - 36.0 g/dL   RDW 18.4 (H) 11.5 - 14.5 %   Platelets 190 150 - 440 K/uL   Neutrophils Relative % 66 %   Neutro Abs 6.0 1.4 - 6.5 K/uL   Lymphocytes Relative 16 %   Lymphs Abs 1.5 1.0 - 3.6 K/uL   Monocytes Relative 14 %   Monocytes Absolute 1.3 (H) 0.2 - 1.0 K/uL   Eosinophils Relative 3 %   Eosinophils Absolute 0.3 0 - 0.7 K/uL   Basophils Relative 1 %   Basophils Absolute 0.1 0 - 0.1 K/uL  Basic metabolic panel     Status: Abnormal   Collection Time: 05/29/15  9:52 PM  Result Value Ref Range   Sodium 142 135 - 145 mmol/L   Potassium 4.1 3.5 - 5.1 mmol/L   Chloride 108 101 - 111 mmol/L   CO2 30 22 - 32 mmol/L   Glucose, Bld 105 (H) 65 - 99 mg/dL   BUN 26 (H) 6 - 20 mg/dL   Creatinine, Ser 0.72 0.61 - 1.24 mg/dL   Calcium 10.1 8.9 - 10.3 mg/dL   GFR calc non Af Amer >60 >60 mL/min   GFR calc Af Amer >60 >60 mL/min    Comment: (NOTE) The eGFR has been calculated using the CKD EPI equation. This calculation has not been validated in all clinical situations. eGFR's persistently <60 mL/min signify possible Chronic Kidney Disease.    Anion gap 4 (L) 5 - 15  Troponin I     Status: None   Collection Time: 05/29/15  9:52 PM  Result Value Ref Range   Troponin I <0.03 <0.031 ng/mL    Comment:        NO INDICATION OF MYOCARDIAL INJURY.    Brain natriuretic peptide     Status: Abnormal   Collection Time: 05/29/15  9:52 PM  Result Value Ref Range   B Natriuretic Peptide 487.0 (H) 0.0 - 100.0 pg/mL  Urinalysis complete, with microscopic ([facility] only)     Status: Abnormal   Collection Time: 05/29/15 11:53 PM  Result Value Ref Range   Color,  Urine YELLOW (A) YELLOW   APPearance CLEAR (A) CLEAR   Glucose, UA NEGATIVE NEGATIVE mg/dL   Bilirubin Urine NEGATIVE NEGATIVE   Ketones, ur NEGATIVE NEGATIVE mg/dL   Specific Gravity, Urine 1.019 1.005 - 1.030   Hgb urine dipstick NEGATIVE NEGATIVE   pH 5.0 5.0 - 8.0   Protein, ur 30 (A) NEGATIVE mg/dL   Nitrite NEGATIVE NEGATIVE   Leukocytes, UA NEGATIVE NEGATIVE   RBC / HPF 0-5 0 - 5 RBC/hpf   WBC, UA 0-5 0 - 5 WBC/hpf   Bacteria, UA NONE SEEN NONE SEEN   Squamous Epithelial / LPF 0-5 (A) NONE SEEN   Mucous PRESENT    Hyaline Casts, UA PRESENT    Dg Chest 1 View  05/29/2015  CLINICAL DATA:  Shortness of breath. Chest pain. Hypertension. Symptoms occurred tonight. EXAM: CHEST 1 VIEW COMPARISON:  None. FINDINGS: Atherosclerotic aortic arch. Borderline enlargement of the cardiopericardial silhouette mild obscuration of the left hemidiaphragm medially in the retrocardiac region. There is some calcifications in the right mid lung and right hilum indicative of old granulomatous disease. However, there is also a somewhat irregular 2.3 by 1.6 cm right upper lobe nodule which is not definitively calcified. The Mild upper zone pulmonary vascular prominence. IMPRESSION: 1. 2.3 by 1.6 cm right upper lobe nodule. The possibility of lung cancer is raised. Chest CT is recommended for further workup. If the patient's creatinine allows, pulmonary embolus protocol chest CTA could be utilized but to workup this nodule and rule out underlying vascular abnormalities. 2. Native retrocardiac density possibly from atelectasis or pneumonia. 3. Old granulomatous disease 4. Atherosclerotic aortic arch.  Electronically Signed   By: Van Clines M.D.   On: 05/29/2015 22:11   Ct Chest Wo Contrast  05/29/2015  CLINICAL DATA:  Acute onset of shortness of breath and generalized chest pain. Wheezing. High blood pressure. Initial encounter. EXAM: CT CHEST WITHOUT CONTRAST TECHNIQUE: Multidetector CT imaging of the chest was performed following the standard protocol without IV contrast. COMPARISON:  Chest radiograph performed earlier today at 9:50 p.m. FINDINGS: Small to moderate bilateral pleural effusions are noted, slightly larger on the right. Scattered calcified granulomata are noted within the collapsed portions of the right lower lobe. A 2.1 cm nodule is noted near the right lung apex, concerning for primary malignancy. A prominent calcified granuloma is noted within the right middle lobe. No pneumothorax is seen. Trace pericardial fluid remains within normal limits. Diffuse coronary artery calcifications are seen. Diffuse calcification is noted along the aortic arch and proximal great vessels. The thyroid gland is unremarkable in appearance. No axillary lymphadenopathy is seen. Small bilateral hypodensities in the liver are nonspecific but may reflect small cysts. Numerous calcified granulomata are seen within the spleen. Diffuse calcification is noted along the proximal abdominal aorta and its branches. There appears to be a 3.3 cm solid mass inferior to the splenic hilum, which may reflect a renal mass, though the left kidney is not imaged on this study. No acute osseous abnormalities are seen. IMPRESSION: 1. Small to moderate bilateral pleural effusions, slightly larger on the right. Underlying scattered calcified granulomata within the collapsed portions of the right lower lobe. 2. 2.1 cm nodule near the right lung apex, concerning for primary bronchogenic malignancy. 3. Apparent 3.3 cm solid mass inferior to the splenic hilum. The left kidney is not imaged on this study, but this may reflect a renal  mass. CT of the abdomen and pelvis could be considered for further evaluation, on an elective  nonemergent basis, if deemed clinically appropriate. 4. Diffuse coronary artery calcifications seen. 5. Scattered small bilateral hypodensities within the liver are nonspecific but may reflect small cysts. 6. Diffuse calcification along the proximal abdominal aorta and its branches. Electronically Signed   By: Garald Balding M.D.   On: 05/29/2015 23:57    Review of Systems  Constitutional: Negative for fever and chills.  HENT: Negative for sore throat and tinnitus.   Eyes: Negative for blurred vision and redness.  Respiratory: Positive for shortness of breath. Negative for cough.   Cardiovascular: Positive for chest pain (resolved). Negative for palpitations, orthopnea and PND.  Gastrointestinal: Negative for nausea, vomiting, abdominal pain and diarrhea.  Genitourinary: Negative for dysuria, urgency and frequency.  Musculoskeletal: Negative for myalgias and joint pain.  Skin: Negative for rash.       No lesions  Neurological: Negative for speech change, focal weakness and weakness.       + Lightheaded  Endo/Heme/Allergies: Does not bruise/bleed easily.       No temperature intolerance  Psychiatric/Behavioral: Negative for depression and suicidal ideas.    Blood pressure 167/85, pulse 67, temperature 97.3 F (36.3 C), temperature source Oral, resp. rate 23, height 5' 10"  (1.778 m), weight 78.382 kg (172 lb 12.8 oz), SpO2 95 %. Physical Exam  Nursing note and vitals reviewed. Constitutional: He is oriented to person, place, and time. He appears well-developed and well-nourished. No distress.  HENT:  Head: Normocephalic and atraumatic.  Mouth/Throat: Oropharynx is clear and moist.  Eyes: Conjunctivae and EOM are normal. Pupils are equal, round, and reactive to light. No scleral icterus.  Neck: Normal range of motion. Neck supple. No JVD present. No tracheal deviation present. No thyromegaly  present.  Cardiovascular: Normal rate and normal heart sounds.  An irregularly irregular rhythm present. Exam reveals no gallop and no friction rub.   No murmur heard. Respiratory: He has decreased breath sounds in the right lower field and the left lower field. He has no wheezes. He has no rales. He exhibits no tenderness.  Abdominal breathing  GI: Soft. Bowel sounds are normal. He exhibits no distension. There is no tenderness.  Genitourinary:  Left scrotal tenderness  Musculoskeletal: Normal range of motion. He exhibits edema (trace at ankles).  Lymphadenopathy:    He has no cervical adenopathy.  Neurological: He is alert and oriented to person, place, and time. No cranial nerve deficit.  Skin: Skin is warm and dry. No rash noted. No erythema.  Psychiatric: He has a normal mood and affect. His behavior is normal. Judgment and thought content normal.     Assessment/Plan This is a 81 year old male admitted for hypertensive urgency and shortness of breath. 1. Hypertensive urgency: Uncontrolled blood pressure. In the emergency department the patient's blood pressure remained greater than 180 after sublingual nitroglycerin. I ordered diltiazem 10 mg IV to observe effect on blood pressure as the patient also has atrial fibrillation and may have benefited from a calcium channel blockade. This had a short-term effect on his blood pressure. I have also ordered him for and an extra dose of lisinopril 10 mg by mouth as the patient had been on this medicine (20 mg) recently before admission. Chest pain is resolved. Continue clonidine, lisinopril and metoprolol. 2. Coronary artery disease: Status post stents to the RCA and mid LAD 8 years ago. EKG does not show any indication of myocardial infarction. Troponin is negative. Continue to track cardiac biomarkers. Cardiology consult ordered for further recommendations on blood pressure and medical  management of CAD. Monitor telemetry. Continue aspirin 3.  Shortness of breath: Secondary to pleural effusions. The patient is comfortably tachypnea This time although he has increased work of breathing. I'm concerned that his effusions may be malignant. BNP is mildly elevated although this does not seem to be the source of his effusion given normal left ventricular function on last echocardiogram. Kidney function is good. We will try a dose of Lasix IV. The patient may need therapeutic and diagnostic thoracentesis in the morning such that pleural fluid can be tested for cytology. 4. A. fib: Rate controlled. No anticoagulation due to fall risk. 5. Glaucoma: Continue Cosopt and Lumigan 6. DVT prophylaxis: Heparin 7. GI prophylaxis: None The patient is a full code. Time spent on admission orders and patient care approximately 45 minutes  Harrie Foreman 05/30/2015, 1:19 AM

## 2015-05-30 NOTE — Progress Notes (Signed)
VSS.  Patient had thoracentesis today, 700cc removed, bandaid covering site, no drainage.  No complaints of pain. No complaints of SOB.  Patient states he "feels better after procedure".  Wife at bedside for majority of the day.   Patient changed from full code to DNR.

## 2015-05-30 NOTE — Care Management (Signed)
Patient admitted under observation for shortness of breath and uncontrolled hypertension.  Presents from the [facility] and has been in the skilled nursing for the past month after a 3 day stay at [facility].  CSW referral

## 2015-05-30 NOTE — Progress Notes (Signed)
[facility] [place] NAME: Mason Allen    MR#:  [id number]  DATE OF BIRTH:  [birth year]  SUBJECTIVE:  CHIEF COMPLAINT:   Chief Complaint  Patient presents with  . Hypertension   Short of breath. No chest pain. Weak appearing.  REVIEW OF SYSTEMS:   Review of Systems  Constitutional: Negative for fever.  Respiratory: Positive for shortness of breath.   Cardiovascular: Negative for chest pain and palpitations.  Gastrointestinal: Negative for nausea, vomiting and abdominal pain.  Genitourinary: Negative for dysuria.    DRUG ALLERGIES:   Allergies  Allergen Reactions  . Adenosine Other (See Comments)    Reaction:  Unknown   . Hydrochlorothiazide Other (See Comments)    Reaction:  Unknown   . Ivp Dye [Iodinated Diagnostic Agents] Other (See Comments)    Reaction:  Unknown   . Penicillins Other (See Comments)    Reaction:  Unknown   . Tramadol Other (See Comments)    Reaction:  Unknown     VITALS:  Blood pressure 153/87, pulse 83, temperature 97.7 F (36.5 C), temperature source Oral, resp. rate 19, height 5\' 10"  (1.778 m), weight 78.382 kg (172 lb 12.8 oz), SpO2 94 %.  PHYSICAL EXAMINATION:  GENERAL:  81 y.o.-year-old patient lying in the bed weak appearing  LUNGS: Bibasilar crackles, decreased breath sounds on the righ , short shallow respirationsCARDIOVASCULAR: S1, S2 normal. No murmurs, rubs, or gallops.  ABDOMEN: Soft, nontender, nondistended. Bowel sounds present. No organomegaly or mass.  EXTREMITIES: 1+ bilateralpedal edema, cyanosis, or clubbing.  NEUROLOGIC: Cranial nerves II through XII are intact. Muscle strength 5/5 in all extremities. Sensation intact. Gait not checked.  PSYCHIATRIC: The patient is alert and oriented x 3.  SKIN: No obvious rash, lesion, or ulcer.  Patient is mostly blind  LABORATORY PANEL:   CBC  Recent Labs Lab 05/29/15 2152  WBC 9.1  HGB 13.9  HCT 43.4  PLT 190    ------------------------------------------------------------------------------------------------------------------  Chemistries   Recent Labs Lab 05/29/15 2152  NA 142  K 4.1  CL 108  CO2 30  GLUCOSE 105*  BUN 26*  CREATININE 0.72  CALCIUM 10.1   ------------------------------------------------------------------------------------------------------------------  Cardiac Enzymes  Recent Labs Lab 05/30/15 0837  TROPONINI <0.03   ------------------------------------------------------------------------------------------------------------------  RADIOLOGY:  Dg Chest 1 View  05/30/2015  CLINICAL DATA:  Right thoracentesis EXAM: CHEST 1 VIEW COMPARISON:  Yesterday FINDINGS: There is no pneumothorax after right thoracentesis. Right pleural effusion has resolved. Right upper lobe lung nodule persists. Cardiomegaly is stable. Small left pleural effusion is stable. IMPRESSION: No pneumothorax. Electronically Signed   By: Marybelle Killings M.D.   On: 05/30/2015 14:45   Dg Chest 1 View  05/29/2015  CLINICAL DATA:  Shortness of breath. Chest pain. Hypertension. Symptoms occurred tonight. EXAM: CHEST 1 VIEW COMPARISON:  None. FINDINGS: Atherosclerotic aortic arch. Borderline enlargement of the cardiopericardial silhouette mild obscuration of the left hemidiaphragm medially in the retrocardiac region. There is some calcifications in the right mid lung and right hilum indicative of old granulomatous disease. However, there is also a somewhat irregular 2.3 by 1.6 cm right upper lobe nodule which is not definitively calcified. The Mild upper zone pulmonary vascular prominence. IMPRESSION: 1. 2.3 by 1.6 cm right upper lobe nodule. The possibility of lung cancer is raised. Chest CT is recommended for further workup. If the patient's creatinine allows, pulmonary embolus protocol chest CTA could be utilized but to workup this nodule and rule out underlying vascular abnormalities.  2. Native retrocardiac  density possibly from atelectasis or pneumonia. 3. Old granulomatous disease 4. Atherosclerotic aortic arch. Electronically Signed   By: Van Clines M.D.   On: 05/29/2015 22:11   Ct Chest Wo Contrast  05/29/2015  CLINICAL DATA:  Acute onset of shortness of breath and generalized chest pain. Wheezing. High blood pressure. Initial encounter. EXAM: CT CHEST WITHOUT CONTRAST TECHNIQUE: Multidetector CT imaging of the chest was performed following the standard protocol without IV contrast. COMPARISON:  Chest radiograph performed earlier today at 9:50 p.m. FINDINGS: Small to moderate bilateral pleural effusions are noted, slightly larger on the right. Scattered calcified granulomata are noted within the collapsed portions of the right lower lobe. A 2.1 cm nodule is noted near the right lung apex, concerning for primary malignancy. A prominent calcified granuloma is noted within the right middle lobe. No pneumothorax is seen. Trace pericardial fluid remains within normal limits. Diffuse coronary artery calcifications are seen. Diffuse calcification is noted along the aortic arch and proximal great vessels. The thyroid gland is unremarkable in appearance. No axillary lymphadenopathy is seen. Small bilateral hypodensities in the liver are nonspecific but may reflect small cysts. Numerous calcified granulomata are seen within the spleen. Diffuse calcification is noted along the proximal abdominal aorta and its branches. There appears to be a 3.3 cm solid mass inferior to the splenic hilum, which may reflect a renal mass, though the left kidney is not imaged on this study. No acute osseous abnormalities are seen. IMPRESSION: 1. Small to moderate bilateral pleural effusions, slightly larger on the right. Underlying scattered calcified granulomata within the collapsed portions of the right lower lobe. 2. 2.1 cm nodule near the right lung apex, concerning for primary bronchogenic malignancy. 3. Apparent 3.3 cm solid  mass inferior to the splenic hilum. The left kidney is not imaged on this study, but this may reflect a renal mass. CT of the abdomen and pelvis could be considered for further evaluation, on an elective nonemergent basis, if deemed clinically appropriate. 4. Diffuse coronary artery calcifications seen. 5. Scattered small bilateral hypodensities within the liver are nonspecific but may reflect small cysts. 6. Diffuse calcification along the proximal abdominal aorta and its branches. Electronically Signed   By: Garald Balding M.D.   On: 05/29/2015 23:57   US Thoracentesis Asp Pleural Space W/img Guide  05/30/2015  CLINICAL DATA:  Right pleural effusion EXAM: ULTRASOUND GUIDED RIGHT THORACENTESIS COMPARISON:  None. PROCEDURE: An ultrasound guided thoracentesis was thoroughly discussed with the patient and questions answered. The benefits, risks, alternatives and complications were also discussed. The patient understands and wishes to proceed with the procedure. Written consent was obtained. Ultrasound was performed to localize and mark an adequate pocket of fluid in the right chest. The area was then prepped and draped in the normal sterile fashion. 1% Lidocaine was used for local anesthesia. Under ultrasound guidance a Safe-T-Centesis catheter was introduced. Thoracentesis was performed. The catheter was removed and a dressing applied. COMPLICATIONS: None. FINDINGS: A total of approximately 700 cc of clear fluid was removed. A fluid sample was not sent for laboratory analysis. IMPRESSION: Successful ultrasound guided right thoracentesis yielding 700 cc of pleural fluid. Electronically Signed   By: Marybelle Killings M.D.   On: 05/30/2015 14:46    EKG:   Orders placed or performed during the hospital encounter of 05/29/15  . EKG 12-Lead  . EKG 12-Lead    ASSESSMENT AND PLAN:   1. Right upper lung mass and bilateral pleural effusions: For thoracentesis today  hopefully this will be therapeutic as well as  diagnostic.  2. Hypertensive urgency: Blood pressure under much better control at this time. Continue isosorbide, lisinopril, metoprolol, lisinopril  3. Strict coronary artery disease: Appreciate cardiology consultation. Troponins have been negative. No chest pain. Not currently having exacerbation of coronary artery disease. Continue aspirin, beta blocker, statin  4. Chronic atrial fibrillation: Rate is controlled. Continue aspirin. He has a history of recent hematoma   All the records are reviewed and case discussed with Care Management/Social Workerr. Management plans discussed with the patient, family and they are in agreement.  CODE STATUS: DO NOT RESUSCITATE. This was discussed with the patient and his wife on 11/22  TOTAL TIME TAKING CARE OF THIS PATIENT: 35 minutes.  Greater than 50% of time spent in care coordination and counseling. POSSIBLE D/C IN 2 DAYS, DEPENDING ON CLINICAL CONDITION.   Myrtis Ser M.D on 05/30/2015 at 5:53 PM  Between 7am to 6pm - Pager - [phone]  After 6pm go to [email or web address] - password [email or web address] [facility]  [facility]  Office  [phone]  CC: Primary care physician; No primary care provider on file.

## 2015-05-30 NOTE — Care Management Obs Status (Signed)
[place] NOTIFICATION   Patient Details  Name: Ryian Ackroyd MRN: [id number] Date of Birth: 1920/10/10   Medicare Observation Status Notification Given:  Yes  Patient admitted under observation.  Presented and explained observation notice.  Daughter signed.  Original given to patient and copy placed in medical record.  Copy manually delivered to HIM    Katrina Stack, RN 05/30/2015, 8:48 AM

## 2015-05-30 NOTE — ED Notes (Signed)
Called pharmacy to send up [place].

## 2015-05-31 DIAGNOSIS — I160 Hypertensive urgency: Secondary | ICD-10-CM | POA: Diagnosis not present

## 2015-05-31 DIAGNOSIS — R0600 Dyspnea, unspecified: Secondary | ICD-10-CM

## 2015-05-31 DIAGNOSIS — R079 Chest pain, unspecified: Secondary | ICD-10-CM

## 2015-05-31 DIAGNOSIS — J90 Pleural effusion, not elsewhere classified: Secondary | ICD-10-CM | POA: Diagnosis not present

## 2015-05-31 DIAGNOSIS — R911 Solitary pulmonary nodule: Secondary | ICD-10-CM | POA: Diagnosis not present

## 2015-05-31 LAB — BODY FLUID CELL COUNT WITH DIFFERENTIAL
Eos, Fluid: NONE SEEN %
Lymphs, Fluid: 60 %
MONOCYTE-MACROPHAGE-SEROUS FLUID: 37 %
Neutrophil Count, Fluid: 3 %
Other Cells, Fluid: NONE SEEN %
WBC FLUID: 2018 uL

## 2015-05-31 LAB — CBC
HEMATOCRIT: 40 % (ref 40.0–52.0)
Hemoglobin: 13.2 g/dL (ref 13.0–18.0)
MCH: 31.9 pg (ref 26.0–34.0)
MCHC: 33.1 g/dL (ref 32.0–36.0)
MCV: 96.1 fL (ref 80.0–100.0)
PLATELETS: 174 10*3/uL (ref 150–440)
RBC: 4.16 MIL/uL — ABNORMAL LOW (ref 4.40–5.90)
RDW: 17.9 % — AB (ref 11.5–14.5)
WBC: 9 10*3/uL (ref 3.8–10.6)

## 2015-05-31 LAB — BASIC METABOLIC PANEL
ANION GAP: 6 (ref 5–15)
BUN: 27 mg/dL — ABNORMAL HIGH (ref 6–20)
CALCIUM: 10 mg/dL (ref 8.9–10.3)
CO2: 32 mmol/L (ref 22–32)
CREATININE: 1.08 mg/dL (ref 0.61–1.24)
Chloride: 109 mmol/L (ref 101–111)
GFR, EST NON AFRICAN AMERICAN: 58 mL/min — AB (ref >60)
Glucose, Bld: 96 mg/dL (ref 65–99)
Potassium: 3.8 mmol/L (ref 3.5–5.1)
SODIUM: 147 mmol/L — AB (ref 135–145)

## 2015-05-31 MED ORDER — FUROSEMIDE 10 MG/ML IJ SOLN
20.0000 mg | Freq: Two times a day (BID) | INTRAMUSCULAR | Status: DC
  Administered 2015-05-31 – 2015-06-02 (×4): 20 mg via INTRAVENOUS
  Filled 2015-05-31 (×4): qty 2

## 2015-05-31 NOTE — Progress Notes (Signed)
[facility] [place] NAME: Mason Allen    MR#:  [id number]  DATE OF BIRTH:  [birth year]  SUBJECTIVE:  CHIEF COMPLAINT:   Chief Complaint  Patient presents with  . Hypertension   Feels somewhat better after thoracentesis (700 cc removed). Wife at bedside.  REVIEW OF SYSTEMS:   Review of Systems  Constitutional: Negative for fever, weight loss, malaise/fatigue and diaphoresis.  HENT: Negative for ear discharge, ear pain, hearing loss, nosebleeds, sore throat and tinnitus.   Eyes: Negative for blurred vision and pain.  Respiratory: Positive for shortness of breath. Negative for cough, hemoptysis and wheezing.   Cardiovascular: Negative for chest pain, palpitations, orthopnea and leg swelling.  Gastrointestinal: Negative for heartburn, nausea, vomiting, abdominal pain, diarrhea, constipation and blood in stool.  Genitourinary: Negative for dysuria, urgency and frequency.  Musculoskeletal: Negative for myalgias and back pain.  Skin: Negative for itching and rash.  Neurological: Negative for dizziness, tingling, tremors, focal weakness, seizures, weakness and headaches.  Psychiatric/Behavioral: Negative for depression. The patient is not nervous/anxious.     DRUG ALLERGIES:   Allergies  Allergen Reactions  . Adenosine Other (See Comments)    Reaction:  Unknown   . Hydrochlorothiazide Other (See Comments)    Reaction:  Unknown   . Ivp Dye [Iodinated Diagnostic Agents] Other (See Comments)    Reaction:  Unknown   . Penicillins Other (See Comments)    Reaction:  Unknown   . Tramadol Other (See Comments)    Reaction:  Unknown     VITALS:  Blood pressure 178/92, pulse 87, temperature 97.9 F (36.6 C), temperature source Oral, resp. rate 21, height 5\' 10"  (1.778 m), weight 70.625 kg (155 lb 11.2 oz), SpO2 96 %.  PHYSICAL EXAMINATION:  GENERAL:  81 y.o.-year-old patient lying in the bed weak appearing  LUNGS: Bibasilar  crackles, decreased breath sounds on the righ , short shallow respirationsCARDIOVASCULAR: S1, S2 normal. No murmurs, rubs, or gallops.  ABDOMEN: Soft, nontender, nondistended. Bowel sounds present. No organomegaly or mass.  EXTREMITIES: 1+ bilateralpedal edema, cyanosis, or clubbing.  NEUROLOGIC: Cranial nerves II through XII are intact. Muscle strength 5/5 in all extremities. Sensation intact. Gait not checked.  PSYCHIATRIC: The patient is alert and oriented x 3.  SKIN: No obvious rash, lesion, or ulcer.  Patient is mostly blind LABORATORY PANEL:   CBC  Recent Labs Lab 05/31/15 0417  WBC 9.0  HGB 13.2  HCT 40.0  PLT 174   ------------------------------------------------------------------------------------------------------------------  Chemistries   Recent Labs Lab 05/31/15 0417  NA 147*  K 3.8  CL 109  CO2 32  GLUCOSE 96  BUN 27*  CREATININE 1.08  CALCIUM 10.0   ------------------------------------------------------------------------------------------------------------------  Cardiac Enzymes  Recent Labs Lab 05/30/15 0837  TROPONINI <0.03   ------------------------------------------------------------------------------------------------------------------  RADIOLOGY:  Dg Chest 1 View  05/30/2015  CLINICAL DATA:  Right thoracentesis EXAM: CHEST 1 VIEW COMPARISON:  Yesterday FINDINGS: There is no pneumothorax after right thoracentesis. Right pleural effusion has resolved. Right upper lobe lung nodule persists. Cardiomegaly is stable. Small left pleural effusion is stable. IMPRESSION: No pneumothorax. Electronically Signed   By: Marybelle Killings M.D.   On: 05/30/2015 14:45   Dg Chest 1 View  05/29/2015  CLINICAL DATA:  Shortness of breath. Chest pain. Hypertension. Symptoms occurred tonight. EXAM: CHEST 1 VIEW COMPARISON:  None. FINDINGS: Atherosclerotic aortic arch. Borderline enlargement of the cardiopericardial silhouette mild obscuration of the left hemidiaphragm  medially in the retrocardiac region. There is some  calcifications in the right mid lung and right hilum indicative of old granulomatous disease. However, there is also a somewhat irregular 2.3 by 1.6 cm right upper lobe nodule which is not definitively calcified. The Mild upper zone pulmonary vascular prominence. IMPRESSION: 1. 2.3 by 1.6 cm right upper lobe nodule. The possibility of lung cancer is raised. Chest CT is recommended for further workup. If the patient's creatinine allows, pulmonary embolus protocol chest CTA could be utilized but to workup this nodule and rule out underlying vascular abnormalities. 2. Native retrocardiac density possibly from atelectasis or pneumonia. 3. Old granulomatous disease 4. Atherosclerotic aortic arch. Electronically Signed   By: Van Clines M.D.   On: 05/29/2015 22:11   Ct Chest Wo Contrast  05/29/2015  CLINICAL DATA:  Acute onset of shortness of breath and generalized chest pain. Wheezing. High blood pressure. Initial encounter. EXAM: CT CHEST WITHOUT CONTRAST TECHNIQUE: Multidetector CT imaging of the chest was performed following the standard protocol without IV contrast. COMPARISON:  Chest radiograph performed earlier today at 9:50 p.m. FINDINGS: Small to moderate bilateral pleural effusions are noted, slightly larger on the right. Scattered calcified granulomata are noted within the collapsed portions of the right lower lobe. A 2.1 cm nodule is noted near the right lung apex, concerning for primary malignancy. A prominent calcified granuloma is noted within the right middle lobe. No pneumothorax is seen. Trace pericardial fluid remains within normal limits. Diffuse coronary artery calcifications are seen. Diffuse calcification is noted along the aortic arch and proximal great vessels. The thyroid gland is unremarkable in appearance. No axillary lymphadenopathy is seen. Small bilateral hypodensities in the liver are nonspecific but may reflect small cysts.  Numerous calcified granulomata are seen within the spleen. Diffuse calcification is noted along the proximal abdominal aorta and its branches. There appears to be a 3.3 cm solid mass inferior to the splenic hilum, which may reflect a renal mass, though the left kidney is not imaged on this study. No acute osseous abnormalities are seen. IMPRESSION: 1. Small to moderate bilateral pleural effusions, slightly larger on the right. Underlying scattered calcified granulomata within the collapsed portions of the right lower lobe. 2. 2.1 cm nodule near the right lung apex, concerning for primary bronchogenic malignancy. 3. Apparent 3.3 cm solid mass inferior to the splenic hilum. The left kidney is not imaged on this study, but this may reflect a renal mass. CT of the abdomen and pelvis could be considered for further evaluation, on an elective nonemergent basis, if deemed clinically appropriate. 4. Diffuse coronary artery calcifications seen. 5. Scattered small bilateral hypodensities within the liver are nonspecific but may reflect small cysts. 6. Diffuse calcification along the proximal abdominal aorta and its branches. Electronically Signed   By: Garald Balding M.D.   On: 05/29/2015 23:57   US Thoracentesis Asp Pleural Space W/img Guide  05/30/2015  CLINICAL DATA:  Right pleural effusion EXAM: ULTRASOUND GUIDED RIGHT THORACENTESIS COMPARISON:  None. PROCEDURE: An ultrasound guided thoracentesis was thoroughly discussed with the patient and questions answered. The benefits, risks, alternatives and complications were also discussed. The patient understands and wishes to proceed with the procedure. Written consent was obtained. Ultrasound was performed to localize and mark an adequate pocket of fluid in the right chest. The area was then prepped and draped in the normal sterile fashion. 1% Lidocaine was used for local anesthesia. Under ultrasound guidance a Safe-T-Centesis catheter was introduced. Thoracentesis was  performed. The catheter was removed and a dressing applied. COMPLICATIONS: None. FINDINGS: A  total of approximately 700 cc of clear fluid was removed. A fluid sample was not sent for laboratory analysis. IMPRESSION: Successful ultrasound guided right thoracentesis yielding 700 cc of pleural fluid. Electronically Signed   By: Marybelle Killings M.D.   On: 05/30/2015 14:46    EKG:   Orders placed or performed during the hospital encounter of 05/29/15  . EKG 12-Lead  . EKG 12-Lead    ASSESSMENT AND PLAN:   1. Right upper lung mass and bilateral pleural effusions: s/p thoracentesis on 11/22, with 700 cc fluid removal. Will get Pulmo c/s for further eval (likely malignancy)  2. Hypertensive urgency: Blood pressure under much better control at this time. Continue isosorbide, lisinopril, metoprolol, lisinopril  3. Strict coronary artery disease: Appreciate cardiology consultation. Troponins have been negative. No chest pain. Not currently having exacerbation of coronary artery disease. Continue aspirin, beta blocker, statin  4. Chronic atrial fibrillation: Rate is controlled. Continue aspirin. He has a history of recent hematoma   All the records are reviewed and case discussed with Care Management/Social Worker. Management plans discussed with the patient, family and they are in agreement.  CODE STATUS: DO NOT RESUSCITATE. This was discussed with the patient and his wife on 11/23 and daughter on phone  TOTAL TIME TAKING CARE OF THIS PATIENT: 35 minutes.   Greater than 50% of time spent in care coordination and counseling. (d/w daughter on phone) POSSIBLE D/C IN 1-2 DAYS, DEPENDING ON CLINICAL CONDITION.   [facility], Arrow Emmerich M.D on 05/31/2015 at 5:02 PM  Between 7am to 6pm - Pager - [phone]  After 6pm go to [email or web address] - password [email or web address] [facility]  [facility]  Office  [phone]  CC: Primary care physician; No primary care provider on file.

## 2015-05-31 NOTE — NC FL2 (Signed)
[place] LEVEL OF CARE SCREENING TOOL     IDENTIFICATION  Patient Name: Mason Allen Birthdate: 1920/10/04 Sex: male Admission Date (Current Location): 05/29/2015  [place] and [place] Number:  Set designer)   Facility and Address:  [facility], [address], [place], [place] [address]      Provider Number: [id number]  Attending Physician Name and Address:  Max Sane, MD  Relative Name and Phone Number:       Current Level of Care: Hospital Recommended Level of Care: [place] Prior Approval Number:    Date Approved/Denied:   PASRR Number:    Discharge Plan: SNF    Current Diagnoses: Patient Active Problem List   Diagnosis Date Noted  . Chest pain 05/30/2015    Orientation ACTIVITIES/SOCIAL BLADDER RESPIRATION       Active Continent Normal  BEHAVIORAL SYMPTOMS/MOOD NEUROLOGICAL BOWEL NUTRITION STATUS      Continent Diet (heart healthy)  PHYSICIAN VISITS COMMUNICATION OF NEEDS Height & Weight Skin    Verbally 5\' 10"  (177.8 cm) 172 lbs. Normal          AMBULATORY STATUS RESPIRATION    Assist extensive Normal      Personal Care Assistance Level of Assistance  Bathing, Feeding, Dressing Bathing Assistance: Maximum assistance Feeding assistance: Independent Dressing Assistance: Maximum assistance      Functional Limitations Info  Sight, Hearing, Speech Sight Info: Impaired Hearing Info: Adequate Speech Info: Adequate       SPECIAL CARE FACTORS FREQUENCY                      Additional Factors Info  Code Status, Allergies Code Status Info:  (DNR) Allergies Info:  (Adenosine, Hydrochlorothiazide, Ivp Dye, Penicillins, Tramadol)           Current Medications (05/31/2015): Current Facility-Administered Medications  Medication Dose Route Frequency Provider Last Rate Last Dose  . acetaminophen (TYLENOL) tablet 650 mg  650 mg Oral Q6H PRN Harrie Foreman, MD       Or  .  acetaminophen (TYLENOL) suppository 650 mg  650 mg Rectal Q6H PRN Harrie Foreman, MD      . aspirin EC tablet 81 mg  81 mg Oral Daily Harrie Foreman, MD   81 mg at 05/31/15 0956  . brimonidine (ALPHAGAN) 0.2 % ophthalmic solution 1 drop  1 drop Right Eye TID Harrie Foreman, MD   1 drop at 05/31/15 0957  . cholecalciferol (VITAMIN D) tablet 1,000 Units  1,000 Units Oral Daily Harrie Foreman, MD   1,000 Units at 05/31/15 [phone]  . docusate sodium (COLACE) capsule 100 mg  100 mg Oral BID Harrie Foreman, MD   100 mg at 05/31/15 [id number]  . dorzolamide-timolol (COSOPT) 22.3-6.8 MG/ML ophthalmic solution 1 drop  1 drop Right Eye BID Harrie Foreman, MD   1 drop at 05/31/15 0957  . isosorbide mononitrate (IMDUR) 24 hr tablet 30 mg  30 mg Oral Daily Harrie Foreman, MD   30 mg at 05/31/15 0955  . lactulose (CHRONULAC) 10 GM/15ML solution 20 g  20 g Oral BID Harrie Foreman, MD   20 g at 05/31/15 0955  . latanoprost (XALATAN) 0.005 % ophthalmic solution 1 drop  1 drop Both Eyes QHS Harrie Foreman, MD   1 drop at 05/30/15 2100  . lidocaine (LIDODERM) 5 % 1 patch  1 patch Transdermal Q24H Harrie Foreman, MD   1 patch at 05/31/15 [phone]  .  lisinopril (PRINIVIL,ZESTRIL) tablet 20 mg  20 mg Oral Daily Harrie Foreman, MD   20 mg at 05/31/15 0956  . metoprolol succinate (TOPROL-XL) 24 hr tablet 100 mg  100 mg Oral Daily Harrie Foreman, MD   100 mg at 05/31/15 0956  . morphine 2 MG/ML injection 2 mg  2 mg Intravenous Q4H PRN Harrie Foreman, MD      . MUSCLE RUB CREA 1 application  1 application Topical QID PRN Harrie Foreman, MD      . nitroGLYCERIN (NITROSTAT) SL tablet 0.4 mg  0.4 mg Sublingual Q5 min PRN Orbie Pyo, MD   0.4 mg at 05/29/15 2201  . ondansetron (ZOFRAN) tablet 4 mg  4 mg Oral Q6H PRN Harrie Foreman, MD       Or  . ondansetron [facility]) injection 4 mg  4 mg Intravenous Q6H PRN Harrie Foreman, MD      . simvastatin (ZOCOR) tablet 20 mg  20 mg Oral QHS  Harrie Foreman, MD   20 mg at 05/30/15 2100  . sodium chloride 0.9 % injection 3 mL  3 mL Intravenous PRN Aldean Jewett, MD       Do not use this list as official medication orders. Please verify with discharge summary.  Discharge Medications:   Medication List    ASK your doctor about these medications        aspirin EC 81 MG tablet  Take 81 mg by mouth daily.     bimatoprost 0.01 % Soln  Commonly known as:  LUMIGAN  Place 1 drop into both eyes at bedtime.     brimonidine 0.2 % ophthalmic solution  Commonly known as:  ALPHAGAN  Place 1 drop into the right eye 3 (three) times daily.     cholecalciferol 1000 UNITS tablet  Commonly known as:  VITAMIN D  Take 1,000 Units by mouth daily.     cloNIDine 0.1 MG tablet  Commonly known as:  CATAPRES  Take 0.1 mg by mouth once.     dorzolamide-timolol 22.3-6.8 MG/ML ophthalmic solution  Commonly known as:  COSOPT  Place 1 drop into the right eye 2 (two) times daily.     HYDROcodone-acetaminophen 5-325 MG tablet  Commonly known as:  NORCO/VICODIN  Take 1 tablet by mouth every 4 (four) hours as needed for moderate pain.     lactulose 10 GM/15ML solution  Commonly known as:  CHRONULAC  Take 20 g by mouth 2 (two) times daily.     lidocaine 5 %  Commonly known as:  LIDODERM  Place 1 patch onto the skin daily. Remove & Discard patch within 12 hours or as directed by MD     lisinopril 20 MG tablet  Commonly known as:  PRINIVIL,ZESTRIL  Take 20 mg by mouth daily.     metoprolol succinate 100 MG 24 hr tablet  Commonly known as:  TOPROL-XL  Take 100 mg by mouth daily.     simvastatin 20 MG tablet  Commonly known as:  ZOCOR  Take 20 mg by mouth at bedtime.     THERA-GESIC 1-15 % Crea  Apply 1 application topically 4 (four) times daily as needed (for pain).        Relevant Imaging Results:  Relevant Lab Results:  Recent Labs    Additional Information  (SS: [id number])  Maurine Cane, LCSW

## 2015-05-31 NOTE — Progress Notes (Signed)
[facility] Cardiology  SUBJECTIVE: I feel better   Filed Vitals:   05/30/15 1438 05/30/15 2021 05/31/15 0356 05/31/15 1158  BP: 153/87 154/76 160/98 178/92  Pulse: 83 73 92 87  Temp:  98.5 F (36.9 C) 98.3 F (36.8 C) 97.9 F (36.6 C)  TempSrc:  Oral  Oral  Resp:  18 18 21   Height:      Weight:   70.625 kg (155 lb 11.2 oz)   SpO2: 94% 94% 95% 96%     Intake/Output Summary (Last 24 hours) at 05/31/15 1317 Last data filed at 05/31/15 1200  Gross per 24 hour  Intake    480 ml  Output    525 ml  Net    -45 ml      PHYSICAL EXAM  General: Well developed, well nourished, in no acute distress HEENT:  Normocephalic and atramatic Neck:  No JVD.  Lungs: Clear bilaterally to auscultation and percussion. Heart: HRRR . Normal S1 and S2 without gallops or murmurs.  Abdomen: Bowel sounds are positive, abdomen soft and non-tender  Msk:  Back normal, normal gait. Normal strength and tone for age. Extremities: No clubbing, cyanosis or edema.   Neuro: Alert and oriented X 3. Psych:  Good affect, responds appropriately   LABS: Basic Metabolic Panel:  Recent Labs  05/29/15 2152 05/31/15 0417  NA 142 147*  K 4.1 3.8  CL 108 109  CO2 30 32  GLUCOSE 105* 96  BUN 26* 27*  CREATININE 0.72 1.08  CALCIUM 10.1 10.0   Liver Function Tests: No results for input(s): AST, ALT, ALKPHOS, BILITOT, PROT, ALBUMIN in the last 72 hours.  Recent Labs  05/30/15 1510  AMYLASE 44   CBC:  Recent Labs  05/29/15 2152 05/31/15 0417  WBC 9.1 9.0  NEUTROABS 6.0  --   HGB 13.9 13.2  HCT 43.4 40.0  MCV 96.5 96.1  PLT 190 174   Cardiac Enzymes:  Recent Labs  05/29/15 2152 05/30/15 0244 05/30/15 0837  TROPONINI <0.03 <0.03 <0.03   BNP: Invalid input(s): POCBNP D-Dimer: No results for input(s): DDIMER in the last 72 hours. Hemoglobin A1C:  Recent Labs  05/30/15 0244  HGBA1C 4.8   Fasting Lipid Panel: No results for input(s): CHOL, HDL, LDLCALC, TRIG, CHOLHDL, LDLDIRECT in the  last 72 hours. Thyroid Function Tests:  Recent Labs  05/30/15 0244  TSH 1.035   Anemia Panel: No results for input(s): VITAMINB12, FOLATE, FERRITIN, TIBC, IRON, RETICCTPCT in the last 72 hours.  Dg Chest 1 View  05/30/2015  CLINICAL DATA:  Right thoracentesis EXAM: CHEST 1 VIEW COMPARISON:  Yesterday FINDINGS: There is no pneumothorax after right thoracentesis. Right pleural effusion has resolved. Right upper lobe lung nodule persists. Cardiomegaly is stable. Small left pleural effusion is stable. IMPRESSION: No pneumothorax. Electronically Signed   By: Marybelle Killings M.D.   On: 05/30/2015 14:45   Dg Chest 1 View  05/29/2015  CLINICAL DATA:  Shortness of breath. Chest pain. Hypertension. Symptoms occurred tonight. EXAM: CHEST 1 VIEW COMPARISON:  None. FINDINGS: Atherosclerotic aortic arch. Borderline enlargement of the cardiopericardial silhouette mild obscuration of the left hemidiaphragm medially in the retrocardiac region. There is some calcifications in the right mid lung and right hilum indicative of old granulomatous disease. However, there is also a somewhat irregular 2.3 by 1.6 cm right upper lobe nodule which is not definitively calcified. The Mild upper zone pulmonary vascular prominence. IMPRESSION: 1. 2.3 by 1.6 cm right upper lobe nodule. The possibility of lung cancer is  raised. Chest CT is recommended for further workup. If the patient's creatinine allows, pulmonary embolus protocol chest CTA could be utilized but to workup this nodule and rule out underlying vascular abnormalities. 2. Native retrocardiac density possibly from atelectasis or pneumonia. 3. Old granulomatous disease 4. Atherosclerotic aortic arch. Electronically Signed   By: Van Clines M.D.   On: 05/29/2015 22:11   Ct Chest Wo Contrast  05/29/2015  CLINICAL DATA:  Acute onset of shortness of breath and generalized chest pain. Wheezing. High blood pressure. Initial encounter. EXAM: CT CHEST WITHOUT CONTRAST  TECHNIQUE: Multidetector CT imaging of the chest was performed following the standard protocol without IV contrast. COMPARISON:  Chest radiograph performed earlier today at 9:50 p.m. FINDINGS: Small to moderate bilateral pleural effusions are noted, slightly larger on the right. Scattered calcified granulomata are noted within the collapsed portions of the right lower lobe. A 2.1 cm nodule is noted near the right lung apex, concerning for primary malignancy. A prominent calcified granuloma is noted within the right middle lobe. No pneumothorax is seen. Trace pericardial fluid remains within normal limits. Diffuse coronary artery calcifications are seen. Diffuse calcification is noted along the aortic arch and proximal great vessels. The thyroid gland is unremarkable in appearance. No axillary lymphadenopathy is seen. Small bilateral hypodensities in the liver are nonspecific but may reflect small cysts. Numerous calcified granulomata are seen within the spleen. Diffuse calcification is noted along the proximal abdominal aorta and its branches. There appears to be a 3.3 cm solid mass inferior to the splenic hilum, which may reflect a renal mass, though the left kidney is not imaged on this study. No acute osseous abnormalities are seen. IMPRESSION: 1. Small to moderate bilateral pleural effusions, slightly larger on the right. Underlying scattered calcified granulomata within the collapsed portions of the right lower lobe. 2. 2.1 cm nodule near the right lung apex, concerning for primary bronchogenic malignancy. 3. Apparent 3.3 cm solid mass inferior to the splenic hilum. The left kidney is not imaged on this study, but this may reflect a renal mass. CT of the abdomen and pelvis could be considered for further evaluation, on an elective nonemergent basis, if deemed clinically appropriate. 4. Diffuse coronary artery calcifications seen. 5. Scattered small bilateral hypodensities within the liver are nonspecific but  may reflect small cysts. 6. Diffuse calcification along the proximal abdominal aorta and its branches. Electronically Signed   By: Garald Balding M.D.   On: 05/29/2015 23:57   US Thoracentesis Asp Pleural Space W/img Guide  05/30/2015  CLINICAL DATA:  Right pleural effusion EXAM: ULTRASOUND GUIDED RIGHT THORACENTESIS COMPARISON:  None. PROCEDURE: An ultrasound guided thoracentesis was thoroughly discussed with the patient and questions answered. The benefits, risks, alternatives and complications were also discussed. The patient understands and wishes to proceed with the procedure. Written consent was obtained. Ultrasound was performed to localize and mark an adequate pocket of fluid in the right chest. The area was then prepped and draped in the normal sterile fashion. 1% Lidocaine was used for local anesthesia. Under ultrasound guidance a Safe-T-Centesis catheter was introduced. Thoracentesis was performed. The catheter was removed and a dressing applied. COMPLICATIONS: None. FINDINGS: A total of approximately 700 cc of clear fluid was removed. A fluid sample was not sent for laboratory analysis. IMPRESSION: Successful ultrasound guided right thoracentesis yielding 700 cc of pleural fluid. Electronically Signed   By: Marybelle Killings M.D.   On: 05/30/2015 14:46     Echo   TELEMETRY: Sinus rhythm:  ASSESSMENT  AND PLAN:  Active Problems:   Chest pain    1. Shortness of breath, secondary to COPD, bilateral pleural effusions, improved after thoracentesis 2. Hypertensive urgency, blood pressure improved following thoracentesis 3. CAD, no chest pain, negative troponin  Recommendations  1. Continue current therapy 2. Consider adding low-dose furosemide for better blood pressure control 3. Continue aspirin for stroke prevention 4. Defer further cardiac diagnostics at this time  Signed off for now, please call if any questions   Milo Schreier, MD, PhD, [facility] 05/31/2015 1:17 PM

## 2015-05-31 NOTE — Consult Note (Signed)
PULMONARY / CRITICAL CARE MEDICINE   Name: Mason Allen MRN: [id number] DOB: 08-05-1920    ADMISSION DATE:  05/29/2015 CONSULTATION DATE: 05/31/15  REFERRING MD :  Dr. Max Sane   CHIEF COMPLAINT:     Short of breath Reason for consult - lung nodule and bilateral pleural effusion  HISTORY OF PRESENT ILLNESS    81 year old male with complaint fo shortness of breath and with pleural effusion along with atrial fibrillation. The patient has known coronary artery disease, status post stent RCA 04/21/06, stent mid LAD 08/23/06. The patient has a history of paroxysmal atrial fibrillation and essential hypertension. He presents to [facility] emergency room with progressive exertional dyspnea. Chest x-ray revealed right upper lobe lung nodule with bilateral pleural effusions, this was followed by a Right U\S guided thoracentesis, with 700cc of clear fluid drained.  CT chest after the thoracentesis showed a 2.1cm right apical lung nodule, small to moderate b\l effusions, 3.3cm solid mass in the inferior splenic hilum.  Pulmonary was consulted for further recommendations.     SIGNIFICANT EVENTS  11/21>>Right U\S thoracentesis - 700cc removed,clear   PAST MEDICAL HISTORY    :  Past Medical History  Diagnosis Date  . Atrial fibrillation, unspecified   . Contusion of left hip   . Recurrent falls   . Muscle weakness (generalized)   . Atherosclerotic heart disease of native coronary artery without angina pectoris   . Hypertension   . Primary open angle glaucoma of left eye, severe stage   . Blindness of left eye   . COPD (chronic obstructive pulmonary disease) ([place])   . Hyperlipidemia    Past Surgical History  Procedure Laterality Date  . Cholecystectomy    . Transurethral resection of prostate    . Coronary angioplasty with stent placement    . Carotid stent     Prior to Admission medications   Medication Sig Start Date End Date Taking? Authorizing Provider  aspirin EC 81 MG tablet  Take 81 mg by mouth daily.   Yes Historical Provider, MD  bimatoprost (LUMIGAN) 0.01 % SOLN Place 1 drop into both eyes at bedtime.   Yes Historical Provider, MD  brimonidine (ALPHAGAN) 0.2 % ophthalmic solution Place 1 drop into the right eye 3 (three) times daily.   Yes Historical Provider, MD  cholecalciferol (VITAMIN D) 1000 UNITS tablet Take 1,000 Units by mouth daily.   Yes Historical Provider, MD  cloNIDine (CATAPRES) 0.1 MG tablet Take 0.1 mg by mouth once.   Yes Historical Provider, MD  dorzolamide-timolol (COSOPT) 22.3-6.8 MG/ML ophthalmic solution Place 1 drop into the right eye 2 (two) times daily.   Yes Historical Provider, MD  HYDROcodone-acetaminophen (NORCO/VICODIN) 5-325 MG tablet Take 1 tablet by mouth every 4 (four) hours as needed for moderate pain.   Yes Historical Provider, MD  lactulose (CHRONULAC) 10 GM/15ML solution Take 20 g by mouth 2 (two) times daily.   Yes Historical Provider, MD  lidocaine (LIDODERM) 5 % Place 1 patch onto the skin daily. Remove & Discard patch within 12 hours or as directed by MD   Yes Historical Provider, MD  lisinopril (PRINIVIL,ZESTRIL) 20 MG tablet Take 20 mg by mouth daily.   Yes Historical Provider, MD  Menthol-Methyl Salicylate (THERA-GESIC) 1-15 % CREA Apply 1 application topically 4 (four) times daily as needed (for pain).   Yes Historical Provider, MD  metoprolol succinate (TOPROL-XL) 100 MG 24 hr tablet Take 100 mg by mouth daily.   Yes Historical Provider, MD  simvastatin (ZOCOR) 20  MG tablet Take 20 mg by mouth at bedtime.   Yes Historical Provider, MD   Allergies  Allergen Reactions  . Adenosine Other (See Comments)    Reaction:  Unknown   . Hydrochlorothiazide Other (See Comments)    Reaction:  Unknown   . Ivp Dye [Iodinated Diagnostic Agents] Other (See Comments)    Reaction:  Unknown   . Penicillins Other (See Comments)    Reaction:  Unknown   . Tramadol Other (See Comments)    Reaction:  Unknown      FAMILY HISTORY    Family History  Problem Relation Age of Onset  . Hypertension        SOCIAL HISTORY    reports that he has quit smoking. He does not have any smokeless tobacco history on file. He reports that he drinks about 0.6 oz of alcohol per week. His drug history is not on file.  Review of Systems  Constitutional: Negative for fever, chills and diaphoresis.  Eyes: Negative for blurred vision.  Respiratory: Positive for cough, sputum production and shortness of breath.   Cardiovascular: Negative for chest pain and leg swelling.  Gastrointestinal: Negative for heartburn and blood in stool.  Genitourinary: Negative for dysuria.  Musculoskeletal: Negative for myalgias.  Skin: Negative for itching and rash.  Neurological: Positive for weakness. Negative for dizziness and tingling.  Psychiatric/Behavioral: Negative for depression.      VITAL SIGNS    Temp:  [97.9 F (36.6 C)-98.5 F (36.9 C)] 97.9 F (36.6 C) (11/23 1158) Pulse Rate:  [73-92] 87 (11/23 1158) Resp:  [18-21] 21 (11/23 1158) BP: (154-178)/(76-98) 178/92 mmHg (11/23 1158) SpO2:  [94 %-96 %] 96 % (11/23 1158) Weight:  [155 lb 11.2 oz (70.625 kg)] 155 lb 11.2 oz (70.625 kg) (11/23 0356) HEMODYNAMICS:   VENTILATOR SETTINGS:   INTAKE / OUTPUT:  Intake/Output Summary (Last 24 hours) at 05/31/15 1553 Last data filed at 05/31/15 1415  Gross per 24 hour  Intake    480 ml  Output    525 ml  Net    -45 ml       PHYSICAL EXAM   Physical Exam  Constitutional: He appears well-developed and well-nourished.  HENT:  Head: Normocephalic and atraumatic.  Right Ear: External ear normal.  Left Ear: External ear normal.  Eyes: Conjunctivae and EOM are normal. Pupils are equal, round, and reactive to light.  Pulmonary/Chest: No respiratory distress. He has no wheezes. He exhibits no tenderness.  Dec BS at the bases, mild fine crackles (very mild)  Abdominal: Soft. Bowel sounds are normal.  Musculoskeletal: Normal range  of motion.  Neurological: He is alert.  Skin: Skin is warm and dry.       LABS   LABS:  CBC  Recent Labs Lab 05/29/15 2152 05/31/15 0417  WBC 9.1 9.0  HGB 13.9 13.2  HCT 43.4 40.0  PLT 190 174   Coag's No results for input(s): APTT, INR in the last 168 hours. BMET  Recent Labs Lab 05/29/15 2152 05/31/15 0417  NA 142 147*  K 4.1 3.8  CL 108 109  CO2 30 32  BUN 26* 27*  CREATININE 0.72 1.08  GLUCOSE 105* 96   Electrolytes  Recent Labs Lab 05/29/15 2152 05/31/15 0417  CALCIUM 10.1 10.0   Sepsis Markers No results for input(s): LATICACIDVEN, PROCALCITON, O2SATVEN in the last 168 hours. ABG No results for input(s): PHART, PCO2ART, PO2ART in the last 168 hours. Liver Enzymes No results for input(s): AST, ALT, ALKPHOS,  BILITOT, ALBUMIN in the last 168 hours. Cardiac Enzymes  Recent Labs Lab 05/29/15 2152 05/30/15 0244 05/30/15 0837  TROPONINI <0.03 <0.03 <0.03   Glucose No results for input(s): GLUCAP in the last 168 hours.   Recent Results (from the past 240 hour(s))  Body fluid culture     Status: None (Preliminary result)   Collection Time: 05/30/15  2:00 PM  Result Value Ref Range Status   Specimen Description PLEURAL  Final   Special Requests NONE  Final   Gram Stain PENDING  Incomplete   Culture NO GROWTH < 12 HOURS  Final   Report Status PENDING  Incomplete     Current facility-administered medications:  .  acetaminophen (TYLENOL) tablet 650 mg, 650 mg, Oral, Q6H PRN **OR** acetaminophen (TYLENOL) suppository 650 mg, 650 mg, Rectal, Q6H PRN, Harrie Foreman, MD .  aspirin EC tablet 81 mg, 81 mg, Oral, Daily, Harrie Foreman, MD, 81 mg at 05/31/15 0956 .  brimonidine (ALPHAGAN) 0.2 % ophthalmic solution 1 drop, 1 drop, Right Eye, TID, Harrie Foreman, MD, 1 drop at 05/31/15 0957 .  cholecalciferol (VITAMIN D) tablet 1,000 Units, 1,000 Units, Oral, Daily, Harrie Foreman, MD, 1,000 Units at 05/31/15 [phone] .  docusate sodium  (COLACE) capsule 100 mg, 100 mg, Oral, BID, Harrie Foreman, MD, 100 mg at 05/31/15 0956 .  dorzolamide-timolol (COSOPT) 22.3-6.8 MG/ML ophthalmic solution 1 drop, 1 drop, Right Eye, BID, Harrie Foreman, MD, 1 drop at 05/31/15 0957 .  isosorbide mononitrate (IMDUR) 24 hr tablet 30 mg, 30 mg, Oral, Daily, Harrie Foreman, MD, 30 mg at 05/31/15 0955 .  lactulose (CHRONULAC) 10 GM/15ML solution 20 g, 20 g, Oral, BID, Harrie Foreman, MD, 20 g at 05/31/15 0955 .  latanoprost (XALATAN) 0.005 % ophthalmic solution 1 drop, 1 drop, Both Eyes, QHS, Harrie Foreman, MD, 1 drop at 05/30/15 2100 .  lidocaine (LIDODERM) 5 % 1 patch, 1 patch, Transdermal, Q24H, Harrie Foreman, MD, 1 patch at 05/31/15 [phone] .  lisinopril (PRINIVIL,ZESTRIL) tablet 20 mg, 20 mg, Oral, Daily, Harrie Foreman, MD, 20 mg at 05/31/15 0956 .  metoprolol succinate (TOPROL-XL) 24 hr tablet 100 mg, 100 mg, Oral, Daily, Harrie Foreman, MD, 100 mg at 05/31/15 0956 .  morphine 2 MG/ML injection 2 mg, 2 mg, Intravenous, Q4H PRN, Harrie Foreman, MD .  MUSCLE RUB CREA 1 application, 1 application, Topical, QID PRN, Harrie Foreman, MD .  nitroGLYCERIN (NITROSTAT) SL tablet 0.4 mg, 0.4 mg, Sublingual, Q5 min PRN, Orbie Pyo, MD, 0.4 mg at 05/29/15 2201 .  ondansetron (ZOFRAN) tablet 4 mg, 4 mg, Oral, Q6H PRN **OR** ondansetron (ZOFRAN) injection 4 mg, 4 mg, Intravenous, Q6H PRN, Harrie Foreman, MD .  simvastatin (ZOCOR) tablet 20 mg, 20 mg, Oral, QHS, Harrie Foreman, MD, 20 mg at 05/30/15 2100 .  sodium chloride 0.9 % injection 3 mL, 3 mL, Intravenous, PRN, Aldean Jewett, MD  IMAGING    No results found.    Indwelling Urinary Catheter continued, requirement due to   Reason to continue Indwelling Urinary Catheter for strict Intake/Output monitoring for hemodynamic instability   Central Line continued, requirement due to   Reason to continue [employer] Monitoring of central venous pressure  or other hemodynamic parameters   Ventilator continued, requirement due to, resp failure    Ventilator Sedation RASS 0 to -2   Cultures: BCx2  UC  Sputum  Antibiotics:  Lines:   ASSESSMENT/PLAN  81 yo male with 2.1cm right apical lung nodule and bilateral pleural effusions  Right apical lung nodule - concerned for malignant nodule given location and size, this can Allen further worked up as an outpatient, if patient and family request - of more greater concern is the right 3.3cm lesion inferior to the spleen, possible satellite lesion or primary lesion - recommend heme\onc evaluation, might Allen more prudent to discuss complexity of biopsy either lung or splenic along with possible treatment options by heme\onc prior any further workup   Bilateral pleural effusion - most likely form HTN urgency and flash pulmonary edema - fluid most likely transudative, even though fluid LDH is high, there is no fluid pH or serum LDH to compare it to - recommend sending pleural fluid for cytologic analysis also as part of malignancy workup.  - BP control and gentle diuresis  Dyspnea - multifactorial - pleural effusion, HTN, atrial fibrillation - cont with BP control, rate control  I have personally obtained a history, examined the patient, evaluated laboratory and imaging results, formulated the assessment and plan and placed orders.  Pulmonary consult time - 45 minutes  Vilinda Boehringer, MD [place] [facility] Pager [phone] (please enter 7-digits) On Call Pager [phone] (please enter [phone]-digits)     05/31/2015, 3:53 PM  Note: This note was prepared with Dragon dictation along with smaller phrase technology. Any transcriptional errors that result from this process are unintentional.

## 2015-05-31 NOTE — Clinical Social Work Note (Addendum)
Clinical Social Work Assessment  Patient Details  Name: Mason Allen MRN: [id number] Date of Birth: 1920-12-23  Date of referral:  05/31/15               Reason for consult:   (from [place])                Permission sought to share information with:  Facility Sport and exercise psychologist, Family Supports Permission granted to share information::  Yes, Verbal Permission Granted  Name::      (Wife and  daughter Mason Allen)  Agency::     Relationship::     Contact Information:     Housing/Transportation Living arrangements for the past 2 months:  [place] ([place] apartment at [employer] [facility] [employer]) Source of Information:  Adult Children, Spouse, Scientist, water quality, Facility Patient Interpreter Needed:  None Criminal Activity/Legal Involvement Pertinent to Current Situation/Hospitalization:    Significant Relationships:  Adult Children, Spouse Lives with:  Spouse Do you feel safe going back to the place where you live?    Need for family participation in patient care:  Yes (Comment)  Care giving concerns:  Family is concerned that patient will need a few days in STR before returning home.   Social Worker assessment / plan:  Holiday representative (CSW) consult, patient is from [employer]   Patient was not engaged in conversation with CSW during visit.  He was sleep.  (Information provided by wife and daughter at bedside).  CSW introduced self and explained role of CSW department  Patient currently lives with his wife independet living at the [place]. Has lived there about 6 years.  Patient mostly uses a power chair and walker at baseline.  Family believes patient will need to return for rehab at discharge prior to going back home.  CSW spoke with [facility] to , confirmed patient is able to return if additional rehab days are needed. Room 342  And report to [phone]   CSW will complete FL2,  in anticipation of patient returning to [facility]  if needed at discharge.       Employment status:  Disabled (Comment on whether or not currently receiving Disability) Insurance information:  Medicare PT Recommendations:  Not assessed at this time Information / Referral to community resources:   (none at this time)  Patient/Family's Response to care:  Patient's wife and daughter was appreciative of information provided by CSW   Patient/Family's Understanding of and Emotional Response to Diagnosis, Current Treatment, and Prognosis: Patient' family  understands that he is under continued medical work up at this time.  Once medically stable they would like him to discharge back to SNF.  Emotional Assessment Appearance:  Appears stated age Attitude/Demeanor/Rapport:  Unable to Assess Affect (typically observed):  Unable to Assess Orientation:   (patient sleep during assessment) Alcohol / Substance use:  Never Used Psych involvement (Current and /or in the community):  No (Comment)  Discharge Needs  Concerns to be addressed:  Discharge Planning Concerns, Care Coordination Readmission within the last 30 days:  No Current discharge risk:  Chronically ill, Dependent with Mobility Barriers to Discharge:  Continued Medical Work up   Maurine Cane, LCSW 05/31/2015, 11:55 AM

## 2015-06-01 DIAGNOSIS — I160 Hypertensive urgency: Secondary | ICD-10-CM | POA: Diagnosis not present

## 2015-06-01 LAB — BASIC METABOLIC PANEL
Anion gap: 7 (ref 5–15)
BUN: 21 mg/dL — ABNORMAL HIGH (ref 6–20)
CHLORIDE: 105 mmol/L (ref 101–111)
CO2: 33 mmol/L — AB (ref 22–32)
CREATININE: 0.86 mg/dL (ref 0.61–1.24)
Calcium: 10.2 mg/dL (ref 8.9–10.3)
GFR calc non Af Amer: >60 mL/min (ref >60)
GLUCOSE: 99 mg/dL (ref 65–99)
Potassium: 3.3 mmol/L — ABNORMAL LOW (ref 3.5–5.1)
Sodium: 145 mmol/L (ref 135–145)

## 2015-06-01 LAB — CBC
HEMATOCRIT: 44.3 % (ref 40.0–52.0)
Hemoglobin: 14.7 g/dL (ref 13.0–18.0)
MCH: 31.6 pg (ref 26.0–34.0)
MCHC: 33.2 g/dL (ref 32.0–36.0)
MCV: 95.3 fL (ref 80.0–100.0)
Platelets: 181 10*3/uL (ref 150–440)
RBC: 4.66 MIL/uL (ref 4.40–5.90)
RDW: 17.5 % — AB (ref 11.5–14.5)
WBC: 8.5 10*3/uL (ref 3.8–10.6)

## 2015-06-01 LAB — MAGNESIUM: Magnesium: 1.8 mg/dL (ref 1.7–2.4)

## 2015-06-01 NOTE — Progress Notes (Signed)
[facility] [place] NAME: Mason Allen    MR#:  [id number]  DATE OF BIRTH:  Oct 31, 1920  SUBJECTIVE:  Patient doing well. No acute issues overnight. Wife is at bedside. REVIEW OF SYSTEMS:   Review of Systems  Constitutional: Negative for fever, weight loss, malaise/fatigue and diaphoresis.  HENT: Negative for ear discharge, ear pain, hearing loss, nosebleeds, sore throat and tinnitus.   Eyes: Negative for blurred vision and pain.  Respiratory: Negative for cough, hemoptysis, shortness of breath and wheezing.   Cardiovascular: Negative for chest pain, palpitations, orthopnea and leg swelling.  Gastrointestinal: Negative for heartburn, nausea, vomiting, abdominal pain, diarrhea, constipation and blood in stool.  Genitourinary: Negative for dysuria, urgency and frequency.  Musculoskeletal: Negative for myalgias and back pain.  Skin: Negative for itching and rash.  Neurological: Negative for dizziness, tingling, tremors, focal weakness, seizures, weakness and headaches.  Psychiatric/Behavioral: Negative for depression. The patient is not nervous/anxious.     DRUG ALLERGIES:   Allergies  Allergen Reactions  . Adenosine Other (See Comments)    Reaction:  Unknown   . Hydrochlorothiazide Other (See Comments)    Reaction:  Unknown   . Ivp Dye [Iodinated Diagnostic Agents] Other (See Comments)    Reaction:  Unknown   . Penicillins Other (See Comments)    Reaction:  Unknown   . Tramadol Other (See Comments)    Reaction:  Unknown     VITALS:  Blood pressure 134/79, pulse 92, temperature 98.2 F (36.8 C), temperature source Oral, resp. rate 20, height 5\' 10"  (1.778 m), weight 72.53 kg (159 lb 14.4 oz), SpO2 93 %.  PHYSICAL EXAMINATION:  GENERAL:  81 y.o.-year-old patient lying in the bed weak appearing  LUNGS:, Decreased breath sounds right side crackles or rales heard.  CARDIOVASCULAR: S1, S2 normal. No murmurs, rubs, or gallops.   ABDOMEN: Soft, nontender, nondistended. Bowel sounds present. No organomegaly or mass.  EXTREMITIES: 1+ bilateral pedal edema, cyanosis, or clubbing.  NEUROLOGIC: Cranial nerves II through XII are intact.Gait not checked.  PSYCHIATRIC: The patient is alert and oriented x 3.  SKIN: No obvious rash, lesion, or ulcer.  Patient is mostly blind LABORATORY PANEL:   CBC  Recent Labs Lab 06/01/15 0731  WBC 8.5  HGB 14.7  HCT 44.3  PLT 181   ------------------------------------------------------------------------------------------------------------------  Chemistries   Recent Labs Lab 06/01/15 0731  NA 145  K 3.3*  CL 105  CO2 33*  GLUCOSE 99  BUN 21*  CREATININE 0.86  CALCIUM 10.2  MG 1.8   ------------------------------------------------------------------------------------------------------------------  Cardiac Enzymes  Recent Labs Lab 05/30/15 0837  TROPONINI <0.03   ------------------------------------------------------------------------------------------------------------------  RADIOLOGY:  No results found.  EKG:             ASSESSMENT AND PLAN:   1. Right apical lung mass and bilateral pleural effusions: s/p thoracentesis on 11/22, with 700 cc fluid removal. This is concerning for malignancy. Pulmonary consultation recommends heme/on evaluation As per pulmonary consultation bilateral pleural effusion likely secondary to hypertension urgency and flash pulmonary edema. Fluid is likely transudative.   2. Hypertensive urgency: Blood pressure under much better control at this time. Continue isosorbide, lisinopril, metoprolol,   3. Strict coronary artery disease: Appreciate cardiology consultation. Troponins have been negative. No chest pain. Not currently having exacerbation of coronary artery disease. Continue aspirin, beta blocker, statin  4. Chronic atrial fibrillation: Rate is controlled. Continue aspirin. He has a history of recent  hematoma    Management plans  discussed with the patient's wife who is in agreement CODE STATUS: DO NOT RESUSCITATE. This was discussed with the patient and his wife on 11/23 and daughter on phone  TOTAL TIME TAKING CARE OF THIS PATIENT: 25 minutes.   Greater than 50% of time spent in care coordination and counseling. (d/w daughter on phone) POSSIBLE D/C IN 1-2 DAYS, DEPENDING ON CLINICAL CONDITION.   Verleen Stuckey M.D on 06/01/2015 at 4:04 PM  Between 7am to 6pm - Pager - [phone]  After 6pm go to [email or web address] - password [email or web address]  [place] [facility]  Office  [phone]  CC: Primary care physician; No primary care provider on file.

## 2015-06-02 ENCOUNTER — Observation Stay: Payer: Medicare Other

## 2015-06-02 DIAGNOSIS — Z79899 Other long term (current) drug therapy: Secondary | ICD-10-CM

## 2015-06-02 DIAGNOSIS — R0602 Shortness of breath: Secondary | ICD-10-CM

## 2015-06-02 DIAGNOSIS — F17210 Nicotine dependence, cigarettes, uncomplicated: Secondary | ICD-10-CM | POA: Diagnosis not present

## 2015-06-02 DIAGNOSIS — I10 Essential (primary) hypertension: Secondary | ICD-10-CM

## 2015-06-02 DIAGNOSIS — J449 Chronic obstructive pulmonary disease, unspecified: Secondary | ICD-10-CM

## 2015-06-02 DIAGNOSIS — R634 Abnormal weight loss: Secondary | ICD-10-CM | POA: Diagnosis not present

## 2015-06-02 DIAGNOSIS — J90 Pleural effusion, not elsewhere classified: Secondary | ICD-10-CM | POA: Diagnosis not present

## 2015-06-02 DIAGNOSIS — I160 Hypertensive urgency: Secondary | ICD-10-CM | POA: Diagnosis not present

## 2015-06-02 DIAGNOSIS — R161 Splenomegaly, not elsewhere classified: Secondary | ICD-10-CM

## 2015-06-02 DIAGNOSIS — I4891 Unspecified atrial fibrillation: Secondary | ICD-10-CM

## 2015-06-02 DIAGNOSIS — R911 Solitary pulmonary nodule: Secondary | ICD-10-CM | POA: Diagnosis not present

## 2015-06-02 DIAGNOSIS — R0600 Dyspnea, unspecified: Secondary | ICD-10-CM | POA: Diagnosis not present

## 2015-06-02 LAB — CBC
HCT: 43.4 % (ref 40.0–52.0)
Hemoglobin: 14.1 g/dL (ref 13.0–18.0)
MCH: 31 pg (ref 26.0–34.0)
MCHC: 32.5 g/dL (ref 32.0–36.0)
MCV: 95.3 fL (ref 80.0–100.0)
PLATELETS: 191 10*3/uL (ref 150–440)
RBC: 4.56 MIL/uL (ref 4.40–5.90)
RDW: 17.3 % — AB (ref 11.5–14.5)
WBC: 9.5 10*3/uL (ref 3.8–10.6)

## 2015-06-02 LAB — BASIC METABOLIC PANEL
Anion gap: 6 (ref 5–15)
BUN: 26 mg/dL — AB (ref 6–20)
CALCIUM: 9.9 mg/dL (ref 8.9–10.3)
CO2: 33 mmol/L — ABNORMAL HIGH (ref 22–32)
Chloride: 105 mmol/L (ref 101–111)
Creatinine, Ser: 1 mg/dL (ref 0.61–1.24)
GFR calc Af Amer: >60 mL/min (ref >60)
GLUCOSE: 99 mg/dL (ref 65–99)
Potassium: 3.1 mmol/L — ABNORMAL LOW (ref 3.5–5.1)
Sodium: 144 mmol/L (ref 135–145)

## 2015-06-02 LAB — GLUCOSE, SEROUS FLUID: Glucose, Fluid: 83 mg/dL

## 2015-06-02 LAB — MAGNESIUM: Magnesium: 1.8 mg/dL (ref 1.7–2.4)

## 2015-06-02 MED ORDER — POTASSIUM CHLORIDE CRYS ER 20 MEQ PO TBCR
40.0000 meq | EXTENDED_RELEASE_TABLET | Freq: Once | ORAL | Status: AC
  Administered 2015-06-02: 40 meq via ORAL
  Filled 2015-06-02: qty 2

## 2015-06-02 MED ORDER — ISOSORBIDE MONONITRATE ER 30 MG PO TB24: 30.0000 mg | ORAL_TABLET | Freq: Every day | ORAL | Status: AC

## 2015-06-02 NOTE — Consult Note (Signed)
PULMONARY / CRITICAL CARE MEDICINE   Name: Mason Allen MRN: [id number] DOB: May 28, 1921    ADMISSION DATE:  05/29/2015 CONSULTATION DATE: 05/31/15  REFERRING MD :  Dr. Max Sane   CHIEF COMPLAINT:     Short of breath Reason for consult - lung nodule and bilateral pleural effusion  HISTORY OF PRESENT ILLNESS    81 year old male with complaint fo shortness of breath and with pleural effusion along with atrial fibrillation. The patient has known coronary artery disease, status post stent RCA 04/21/06, stent mid LAD 08/23/06. The patient has a history of paroxysmal atrial fibrillation and essential hypertension. He presents to [facility] emergency room with progressive exertional dyspnea. Chest x-ray revealed right upper lobe lung nodule with bilateral pleural effusions, this was followed by a Right U\S guided thoracentesis, with 700cc of clear fluid drained.  CT chest after the thoracentesis showed a 2.1cm right apical lung nodule, small to moderate b\l effusions, 3.3cm solid mass in the inferior splenic hilum.  Pulmonary was consulted for further recommendations.     SIGNIFICANT EVENTS  11/21>>Right U\S thoracentesis - 700cc removed,clear   PAST MEDICAL HISTORY    :  Past Medical History  Diagnosis Date  . Atrial fibrillation, unspecified   . Contusion of left hip   . Recurrent falls   . Muscle weakness (generalized)   . Atherosclerotic heart disease of native coronary artery without angina pectoris   . Hypertension   . Primary open angle glaucoma of left eye, severe stage   . Blindness of left eye   . COPD (chronic obstructive pulmonary disease) ([place])   . Hyperlipidemia    Past Surgical History  Procedure Laterality Date  . Cholecystectomy    . Transurethral resection of prostate    . Coronary angioplasty with stent placement    . Carotid stent     Prior to Admission medications   Medication Sig Start Date End Date Taking? Authorizing Provider  aspirin EC 81 MG tablet  Take 81 mg by mouth daily.   Yes Historical Provider, MD  bimatoprost (LUMIGAN) 0.01 % SOLN Place 1 drop into both eyes at bedtime.   Yes Historical Provider, MD  brimonidine (ALPHAGAN) 0.2 % ophthalmic solution Place 1 drop into the right eye 3 (three) times daily.   Yes Historical Provider, MD  cholecalciferol (VITAMIN D) 1000 UNITS tablet Take 1,000 Units by mouth daily.   Yes Historical Provider, MD  cloNIDine (CATAPRES) 0.1 MG tablet Take 0.1 mg by mouth once.   Yes Historical Provider, MD  dorzolamide-timolol (COSOPT) 22.3-6.8 MG/ML ophthalmic solution Place 1 drop into the right eye 2 (two) times daily.   Yes Historical Provider, MD  HYDROcodone-acetaminophen (NORCO/VICODIN) 5-325 MG tablet Take 1 tablet by mouth every 4 (four) hours as needed for moderate pain.   Yes Historical Provider, MD  lactulose (CHRONULAC) 10 GM/15ML solution Take 20 g by mouth 2 (two) times daily.   Yes Historical Provider, MD  lidocaine (LIDODERM) 5 % Place 1 patch onto the skin daily. Remove & Discard patch within 12 hours or as directed by MD   Yes Historical Provider, MD  lisinopril (PRINIVIL,ZESTRIL) 20 MG tablet Take 20 mg by mouth daily.   Yes Historical Provider, MD  Menthol-Methyl Salicylate (THERA-GESIC) 1-15 % CREA Apply 1 application topically 4 (four) times daily as needed (for pain).   Yes Historical Provider, MD  metoprolol succinate (TOPROL-XL) 100 MG 24 hr tablet Take 100 mg by mouth daily.   Yes Historical Provider, MD  simvastatin (ZOCOR) 20  MG tablet Take 20 mg by mouth at bedtime.   Yes Historical Provider, MD   Allergies  Allergen Reactions  . Adenosine Other (See Comments)    Reaction:  Unknown   . Hydrochlorothiazide Other (See Comments)    Reaction:  Unknown   . Ivp Dye [Iodinated Diagnostic Agents] Other (See Comments)    Reaction:  Unknown   . Penicillins Other (See Comments)    Reaction:  Unknown   . Tramadol Other (See Comments)    Reaction:  Unknown      FAMILY HISTORY    Family History  Problem Relation Age of Onset  . Hypertension        SOCIAL HISTORY    reports that he has quit smoking. He does not have any smokeless tobacco history on file. He reports that he drinks about 0.6 oz of alcohol per week. His drug history is not on file.  Review of Systems  Constitutional: Negative for diaphoresis.  Eyes: Negative for blurred vision.  Respiratory:       Mild cough Improving sob and sputum production  Cardiovascular: Negative for chest pain and leg swelling.  Gastrointestinal: Negative for heartburn and blood in stool.  Genitourinary: Negative for dysuria.  Musculoskeletal: Negative for myalgias.  Skin: Negative for itching and rash.  Neurological: Positive for weakness. Negative for dizziness and tingling.  Psychiatric/Behavioral: Negative for depression.      VITAL SIGNS    Temp:  [97.6 F (36.4 C)-97.8 F (36.6 C)] 97.6 F (36.4 C) (11/25 0445) Pulse Rate:  [70-93] 70 (11/25 1113) Resp:  [18-19] 18 (11/25 0735) BP: (137-180)/(79-94) 137/79 mmHg (11/25 1113) SpO2:  [93 %-97 %] 94 % (11/25 0735) Weight:  [150 lb 9.6 oz (68.312 kg)] 150 lb 9.6 oz (68.312 kg) (11/25 0445) HEMODYNAMICS:   VENTILATOR SETTINGS:   INTAKE / OUTPUT:  Intake/Output Summary (Last 24 hours) at 06/02/15 1210 Last data filed at 06/02/15 0900  Gross per 24 hour  Intake    120 ml  Output    850 ml  Net   -730 ml       PHYSICAL EXAM   Physical Exam  Constitutional: He appears well-developed and well-nourished.  HENT:  Head: Normocephalic and atraumatic.  Right Ear: External ear normal.  Left Ear: External ear normal.  Eyes: Conjunctivae and EOM are normal. Pupils are equal, round, and reactive to light.  Pulmonary/Chest: No respiratory distress. He has no wheezes. He exhibits no tenderness.  Dec BS at the bases, mild fine crackles (very mild)  Abdominal: Soft. Bowel sounds are normal.  Musculoskeletal: Normal range of motion.  Neurological: He  is alert.  Skin: Skin is warm and dry.       LABS   LABS:  CBC  Recent Labs Lab 05/31/15 0417 06/01/15 0731 06/02/15 0512  WBC 9.0 8.5 9.5  HGB 13.2 14.7 14.1  HCT 40.0 44.3 43.4  PLT 174 181 191   Coag's No results for input(s): APTT, INR in the last 168 hours. BMET  Recent Labs Lab 05/31/15 0417 06/01/15 0731 06/02/15 0512  NA 147* 145 144  K 3.8 3.3* 3.1*  CL 109 105 105  CO2 32 33* 33*  BUN 27* 21* 26*  CREATININE 1.08 0.86 1.00  GLUCOSE 96 99 99   Electrolytes  Recent Labs Lab 05/31/15 0417 06/01/15 0731 06/02/15 0512  CALCIUM 10.0 10.2 9.9  MG  --  1.8 1.8   Sepsis Markers No results for input(s): LATICACIDVEN, PROCALCITON, O2SATVEN in the last  168 hours. ABG No results for input(s): PHART, PCO2ART, PO2ART in the last 168 hours. Liver Enzymes No results for input(s): AST, ALT, ALKPHOS, BILITOT, ALBUMIN in the last 168 hours. Cardiac Enzymes  Recent Labs Lab 05/29/15 2152 05/30/15 0244 05/30/15 0837  TROPONINI <0.03 <0.03 <0.03   Glucose No results for input(s): GLUCAP in the last 168 hours.   Recent Results (from the past 240 hour(s))  Body fluid culture     Status: None (Preliminary result)   Collection Time: 05/30/15  2:00 PM  Result Value Ref Range Status   Specimen Description PLEURAL  Final   Special Requests NONE  Final   Gram Stain FEW WBC SEEN NO ORGANISMS SEEN   Final   Culture NO GROWTH 3 DAYS  Final   Report Status PENDING  Incomplete     Current facility-administered medications:  .  acetaminophen (TYLENOL) tablet 650 mg, 650 mg, Oral, Q6H PRN **OR** acetaminophen (TYLENOL) suppository 650 mg, 650 mg, Rectal, Q6H PRN, Harrie Foreman, MD .  aspirin EC tablet 81 mg, 81 mg, Oral, Daily, Harrie Foreman, MD, 81 mg at 06/02/15 0855 .  brimonidine (ALPHAGAN) 0.2 % ophthalmic solution 1 drop, 1 drop, Right Eye, TID, Harrie Foreman, MD, 1 drop at 06/02/15 1000 .  cholecalciferol (VITAMIN D) tablet 1,000 Units,  1,000 Units, Oral, Daily, Harrie Foreman, MD, 1,000 Units at 06/02/15 [phone]  docusate sodium (COLACE) capsule 100 mg, 100 mg, Oral, BID, Harrie Foreman, MD, 100 mg at 06/02/15 0855 .  dorzolamide-timolol (COSOPT) 22.3-6.8 MG/ML ophthalmic solution 1 drop, 1 drop, Right Eye, BID, Harrie Foreman, MD, 1 drop at 06/02/15 [phone] .  furosemide (LASIX) injection 20 mg, 20 mg, Intravenous, Q12H, Max Sane, MD, 20 mg at 06/02/15 0645 .  isosorbide mononitrate (IMDUR) 24 hr tablet 30 mg, 30 mg, Oral, Daily, Harrie Foreman, MD, 30 mg at 06/02/15 0855 .  lactulose (CHRONULAC) 10 GM/15ML solution 20 g, 20 g, Oral, BID, Harrie Foreman, MD, 20 g at 06/02/15 0854 .  latanoprost (XALATAN) 0.005 % ophthalmic solution 1 drop, 1 drop, Both Eyes, QHS, Harrie Foreman, MD, 1 drop at 05/31/15 2119 .  lidocaine (LIDODERM) 5 % 1 patch, 1 patch, Transdermal, Q24H, Harrie Foreman, MD, 1 patch at 06/02/15 [phone]  lisinopril (PRINIVIL,ZESTRIL) tablet 20 mg, 20 mg, Oral, Daily, Harrie Foreman, MD, 20 mg at 06/02/15 0855 .  metoprolol succinate (TOPROL-XL) 24 hr tablet 100 mg, 100 mg, Oral, Daily, Harrie Foreman, MD, 100 mg at 06/02/15 0854 .  MUSCLE RUB CREA 1 application, 1 application, Topical, QID PRN, Harrie Foreman, MD .  nitroGLYCERIN (NITROSTAT) SL tablet 0.4 mg, 0.4 mg, Sublingual, Q5 min PRN, Orbie Pyo, MD, 0.4 mg at 06/01/15 [id number] .  ondansetron (ZOFRAN) tablet 4 mg, 4 mg, Oral, Q6H PRN **OR** ondansetron (ZOFRAN) injection 4 mg, 4 mg, Intravenous, Q6H PRN, Harrie Foreman, MD .  simvastatin (ZOCOR) tablet 20 mg, 20 mg, Oral, QHS, Harrie Foreman, MD, 20 mg at 05/31/15 2118 .  sodium chloride 0.9 % injection 3 mL, 3 mL, Intravenous, PRN, Aldean Jewett, MD  IMAGING    No results found.    Indwelling Urinary Catheter continued, requirement due to   Reason to continue Indwelling Urinary Catheter for strict Intake/Output monitoring for hemodynamic instability    Central Line continued, requirement due to   Reason to continue [employer] Monitoring of central venous pressure or other hemodynamic parameters  Ventilator continued, requirement due to, resp failure    Ventilator Sedation RASS 0 to -2   Cultures: BCx2  UC  Sputum  Antibiotics:  Lines:   ASSESSMENT/PLAN  81 yo male with 2.1cm right apical lung nodule and bilateral pleural effusions  Right apical lung nodule - concerned for malignant nodule given location and size, this can be further worked up as an outpatient, if patient and family request - of more greater concern is the right 3.3cm lesion inferior to the spleen, possible satellite lesion or primary lesion - recommend heme\onc evaluation, might be more prudent to discuss complexity of biopsy either lung or splenic along with possible treatment options by heme\onc prior any further workup   Bilateral pleural effusion - most likely form HTN urgency and flash pulmonary edema, now with clinical improvement, especially after thoracentesis, will check another CXR - fluid most likely transudative, even though fluid LDH is high, there is no fluid pH or serum LDH to compare it to - recommend sending pleural fluid for cytologic analysis also as part of malignancy workup.  - BP control and gentle diuresis  Dyspnea - slowly improving - multifactorial - pleural effusion, HTN, atrial fibrillation - cont with BP control, rate control  I have personally obtained a history, examined the patient, evaluated laboratory and imaging results, formulated the assessment and plan and placed orders.  Pulmonary consult time - 35 minutes  Vilinda Boehringer, MD [facility] Pager [phone] (please enter 7-[phone]) On Call Pager [phone] (please enter 7-digits)     06/02/2015, 12:10 PM  Note: This note was prepared with Dragon dictation along with smaller phrase technology. Any transcriptional errors that result  from this process are unintentional.

## 2015-06-02 NOTE — Progress Notes (Signed)
[facility]  Date of admission:  05/29/2015  Inpatient day:  06/02/2015  Consulting physician: Dr. Max Sane   Reason for Consultation:  Lung mass, splenic mass  Chief Complaint: Mason Allen is a 81 y.o. male with COPD and coronary artery disease who was admitted with hypertensive urgency and shortness of breath and subsequent imaging revealing pleural effusions.  HPI: The patient has a 30 pack year smoking history.  He quit smoking about 40 years ago.  He notes a history of on/off shortness of breath.  He has been told that he has mild COPD.  He also had CAD s/p stent placement and atrial fibrillation.  He and his wife live at the [facility].  They were previously living in an independent apartment.  He notes chronic pain in his back due to degenerative disk disease.  He comments that he fell and hurt his right hip.  Imaging studies revealed no fracture, but a large hematoma.  Since that time, he transferred to the skilled nursing facility on the 3rd floor at [facility] while his wife has stayed in their apartment.  He originally lost 10 pounds, but is back to his baseline weight of 171 pounds.  His wife notes that he lost mobility secondary to laying around.  He receives physical therapy.  He requires assistance with dressing, shower/bath, and restroom.  He presented to the hospital on 05/29/2015 secondary to progressive shortness of breath.  Chest x-ray revealed 2.3 x 1.6 cm right upper lobe nodule. Chest CT without contrast on 05/29/2015 reveal small to moderate bilateral pleural effusions (larger on the right) and a 2.1 cm nodule near the right lung apex worrisome for primary bronchogenic carcinoma. In addition, there was a 3.3 cm solid mass inferior to the splenic hilum a CT of the abdomen and pelvis is felt beneficial. There were small scattered bilateral hypodensities within the liver nonspecific likely representing small cyst. He has diffuse coronary artery  calcifications and diffuse calcifications along the proximal abdominal aorta and its branches.  The patient underwent right sided thoracentesis on 05/30/2015.  700 cc of clear fluid was removed.  Cytology is pending.  White blood cell count was 2018 with 3% neutrophils, 60% lymphocytes, and  37% monocytes-macrophages.  LDH was 102 (3-23).  Protein was less than 3 g/dL.  Cultures are negative to date.  He has never had a colonoscopy.  Symptomatically, he notes a poor appetite. He has chronic constipation. His wife states that sometime in the past " awhile ago", he has had an upset stomach for which he was taking Tums. He was told to stop taking Tums as his calcium was high. Besides his chronic back pain, he notes some soreness in his right hip following a fall.  Past Medical History  Diagnosis Date  . Atrial fibrillation, unspecified   . Contusion of left hip   . Recurrent falls   . Muscle weakness (generalized)   . Atherosclerotic heart disease of native coronary artery without angina pectoris   . Hypertension   . Primary open angle glaucoma of left eye, severe stage   . Blindness of left eye   . COPD (chronic obstructive pulmonary disease) ([place])   . Hyperlipidemia     Past Surgical History  Procedure Laterality Date  . Cholecystectomy    . Transurethral resection of prostate    . Coronary angioplasty with stent placement    . Carotid stent      Family History  Problem Relation Age of  Onset  . Hypertension      Social History:  reports that he has quit smoking. He does not have any smokeless tobacco history on file. He reports that he drinks about 0.6 oz of alcohol per week. His drug history is not on file. The patient and his wife lived in [place] 6 1/2 years ago.  They currently live at the [facility].   He is accompanied by his wife.  Allergies:  Allergies  Allergen Reactions  . Adenosine Other (See Comments)    Reaction:  Unknown   . Hydrochlorothiazide  Other (See Comments)    Reaction:  Unknown   . Ivp Dye [Iodinated Diagnostic Agents] Other (See Comments)    Reaction:  Unknown   . Penicillins Other (See Comments)    Reaction:  Unknown   . Tramadol Other (See Comments)    Reaction:  Unknown     Medications Prior to Admission  Medication Sig Dispense Refill  . aspirin EC 81 MG tablet Take 81 mg by mouth daily.    . bimatoprost (LUMIGAN) 0.01 % SOLN Place 1 drop into both eyes at bedtime.    . brimonidine (ALPHAGAN) 0.2 % ophthalmic solution Place 1 drop into the right eye 3 (three) times daily.    . cholecalciferol (VITAMIN D) 1000 UNITS tablet Take 1,000 Units by mouth daily.    . cloNIDine (CATAPRES) 0.1 MG tablet Take 0.1 mg by mouth once.    . dorzolamide-timolol (COSOPT) 22.3-6.8 MG/ML ophthalmic solution Place 1 drop into the right eye 2 (two) times daily.    Marland Kitchen HYDROcodone-acetaminophen (NORCO/VICODIN) 5-325 MG tablet Take 1 tablet by mouth every 4 (four) hours as needed for moderate pain.    Marland Kitchen lactulose (CHRONULAC) 10 GM/15ML solution Take 20 g by mouth 2 (two) times daily.    Marland Kitchen lidocaine (LIDODERM) 5 % Place 1 patch onto the skin daily. Remove & Discard patch within 12 hours or as directed by MD    . lisinopril (PRINIVIL,ZESTRIL) 20 MG tablet Take 20 mg by mouth daily.    . Menthol-Methyl Salicylate (THERA-GESIC) 1-15 % CREA Apply 1 application topically 4 (four) times daily as needed (for pain).    . metoprolol succinate (TOPROL-XL) 100 MG 24 hr tablet Take 100 mg by mouth daily.    . simvastatin (ZOCOR) 20 MG tablet Take 20 mg by mouth at bedtime.      Review of Systems: GENERAL:  Fatigue.  Minimal activity.  No fevers or sweats.  Baseline weight 171#.  Weight loss of 21#. PERFORMANCE STATUS (ECOG):  3 HEENT:  Blind in left eye.  Glaucoma.  No visual changes, runny nose, sore throat, mouth sores or tenderness. Lungs: Shortness of breath improved s/p thoracentesis.  Cough.  No hemoptysis. Cardiac:  No chest pain,  palpitations, orthopnea, or PND. GI:  Constipation on lactulose.  No nausea, vomiting, diarrhea, melena or hematochezia.  No prior colonoscopy. GU:  No urgency, frequency, dysuria, or hematuria. Musculoskeletal:  Chronic back pain.  Degenerative disk disease.  No joint pain.  No muscle tenderness. Extremities:  No pain or swelling. Skin:  No rashes or skin changes. Neuro:  No headache, numbness or weakness, balance or coordination issues. Endocrine:  No diabetes, thyroid issues, hot flashes or night sweats. Psych:  No mood changes, depression or anxiety. Pain:  No focal pain. Review of systems:  All other systems reviewed and found to be negative.  Physical Exam:  Blood pressure 137/79, pulse 70, temperature 97.6 F (36.4 C),  temperature source Oral, resp. rate 18, height 5' 10"  (1.778 m), weight 150 lb 9.6 oz (68.312 kg), SpO2 94 %.  GENERAL:  Frail elderly gentleman sitting up in bed on the medical unit in no acute distress. MENTAL STATUS:  Alert and oriented to person, place and time. HEAD:  Blonde graying hair.  Male pattern baldness.  Normocephalic, atraumatic, face symmetric, no Cushingoid features. EYES:  Blue eyes.  Left eye irregular (blind).  Right eye reactive. No conjunctivitis or scleral icterus. ENT:  Oropharynx clear without lesion.  Tongue normal. Mucous membranes moist.  RESPIRATORY:  Decreased breath sounds at the bases.  Clear to auscultation without rales, wheezes or rhonchi. CARDIOVASCULAR:  Irregular rate and rhythm without murmur, rub or gallop. ABDOMEN:  Soft, non-tender, with active bowel sounds, and no appreciable hepatosplenomegaly.  No masses. SKIN:  No rashes, ulcers or lesions. EXTREMITIES: No edema, no skin discoloration or tenderness.  No palpable cords. LYMPH NODES: No palpable cervical, supraclavicular, axillary or inguinal adenopathy  NEUROLOGICAL: Unremarkable. PSYCH:  Appropriate.  Results for orders placed or performed during the hospital encounter  of 05/29/15 (from the past 48 hour(s))  Basic metabolic panel     Status: Abnormal   Collection Time: 06/01/15  7:31 AM  Result Value Ref Range   Sodium 145 135 - 145 mmol/L   Potassium 3.3 (L) 3.5 - 5.1 mmol/L   Chloride 105 101 - 111 mmol/L   CO2 33 (H) 22 - 32 mmol/L   Glucose, Bld 99 65 - 99 mg/dL   BUN 21 (H) 6 - 20 mg/dL   Creatinine, Ser 0.86 0.61 - 1.24 mg/dL   Calcium 10.2 8.9 - 10.3 mg/dL   GFR calc non Af Amer >60 >60 mL/min   GFR calc Af Amer >60 >60 mL/min    Comment: (NOTE) The eGFR has been calculated using the CKD EPI equation. This calculation has not been validated in all clinical situations. eGFR's persistently <60 mL/min signify possible Chronic Kidney Disease.    Anion gap 7 5 - 15  Magnesium     Status: None   Collection Time: 06/01/15  7:31 AM  Result Value Ref Range   Magnesium 1.8 1.7 - 2.4 mg/dL  CBC     Status: Abnormal   Collection Time: 06/01/15  7:31 AM  Result Value Ref Range   WBC 8.5 3.8 - 10.6 K/uL   RBC 4.66 4.40 - 5.90 MIL/uL   Hemoglobin 14.7 13.0 - 18.0 g/dL   HCT 44.3 40.0 - 52.0 %   MCV 95.3 80.0 - 100.0 fL   MCH 31.6 26.0 - 34.0 pg   MCHC 33.2 32.0 - 36.0 g/dL   RDW 17.5 (H) 11.5 - 14.5 %   Platelets 181 150 - 440 K/uL  CBC     Status: Abnormal   Collection Time: 06/02/15  5:12 AM  Result Value Ref Range   WBC 9.5 3.8 - 10.6 K/uL   RBC 4.56 4.40 - 5.90 MIL/uL   Hemoglobin 14.1 13.0 - 18.0 g/dL   HCT 43.4 40.0 - 52.0 %   MCV 95.3 80.0 - 100.0 fL   MCH 31.0 26.0 - 34.0 pg   MCHC 32.5 32.0 - 36.0 g/dL   RDW 17.3 (H) 11.5 - 14.5 %   Platelets 191 150 - 440 K/uL  Basic metabolic panel     Status: Abnormal   Collection Time: 06/02/15  5:12 AM  Result Value Ref Range   Sodium 144 135 - 145 mmol/L   Potassium  3.1 (L) 3.5 - 5.1 mmol/L   Chloride 105 101 - 111 mmol/L   CO2 33 (H) 22 - 32 mmol/L   Glucose, Bld 99 65 - 99 mg/dL   BUN 26 (H) 6 - 20 mg/dL   Creatinine, Ser 1.00 0.61 - 1.24 mg/dL   Calcium 9.9 8.9 - 10.3 mg/dL    GFR calc non Af Amer >60 >60 mL/min   GFR calc Af Amer >60 >60 mL/min    Comment: (NOTE) The eGFR has been calculated using the CKD EPI equation. This calculation has not been validated in all clinical situations. eGFR's persistently <60 mL/min signify possible Chronic Kidney Disease.    Anion gap 6 5 - 15  Magnesium     Status: None   Collection Time: 06/02/15  5:12 AM  Result Value Ref Range   Magnesium 1.8 1.7 - 2.4 mg/dL   Dg Chest Port 1 View  06/02/2015  CLINICAL DATA:  Pleural effusion. EXAM: PORTABLE CHEST 1 VIEW COMPARISON:  Chest radiograph of May 30, 2015. CT scan of May 29, 2015. FINDINGS: Stable cardiomediastinal silhouette. No pneumothorax is noted. No significant pleural effusion is noted. Probable minimal left basilar subsegmental atelectasis is noted. Stable calcified granuloma seen in right midlung. Stable nodular density is noted laterally in right upper lobe concerning for neoplasm or malignancy. Bony thorax is unremarkable. IMPRESSION: Minimal left basilar subsegmental atelectasis. Stable right upper lobe nodule is noted concerning for primary malignancy as described on previous CT scan. Electronically Signed   By: Marijo Conception, M.D.   On: 06/02/2015 12:50    Assessment:  The patient is a 81 y.o. gentleman with 30 pack year smoking with 20 pound weight loss over an unspecified period of time and shortness of breath.  Imaging studies revealed  small to moderate bilateral pleural effusions (larger on the right) and a 2.1 cm nodule near the right lung apex worrisome for primary bronchogenic carcinoma. There was a 3.3 cm solid mass inferior to the splenic hilum a CT of the abdomen and pelvis.   He underwent right sided thoracentesis on 05/30/2015.  700 cc of clear fluid was removed.  Cytology is pending.  White blood cell count was 2018 with 3% neutrophils, 60% lymphocytes, and  37% monocytes-macrophages.  LDH was 102 (3-23).  Protein was less than 3 g/dL.  Cultures  are negative to date.  He has never had a colonoscopy.  Symptomatically, he notes a poor appetite.  Performance status is poor.  He has chronic back pain and right hip soreness following a fall.  Plan:   1.  Discuss both lung nodule with associated pleural effusion as well as mass near splenic hilum.  Discuss waiting for cytology results.  If pleural fluid malignant and secondary to lung cancer, patient would have stage IV disease.  Unclear if the cytology will be malignant or not.  Unclear if the mass near the splenic hilum is related or not (benign versus malignant).  Discuss additional studies (abdomen/pelvic CT without contrast given patient's allergy to contrast dye) or PET scan as an outpatient once pathology available.  Discuss potential work-up in the outpatient department. 2.  Discuss possible stereotactic radiosurgery to isolated lung nodule if patient has localized lung cancer.  I do not feel that the patient is a surgical candidate.  His wife agrees.   3.  Patient states that if he has metastatic disease and cannot be cured with treatment, then he does not want treatment. 4.  Discuss code status.  Patient does not want heroic measures (chest compressions/CPR or intubation/ventilation). 5.  Anticipate follow-up in the outpatient department to complete work-up.  Labs ordered.   Thank you for allowing me to participate in Jaryn Rosko 's care.  I will follow him closely with you while hospitalized and after discharge in the outpatient department.  Lequita Asal, MD  06/02/2015, 1:01 PM

## 2015-06-02 NOTE — Progress Notes (Signed)
PT Cancellation Note  Patient Details Name: Mason Allen MRN: [id number] DOB: 06/10/1921   Cancelled Treatment:    Reason Eval/Treat Not Completed: Other (comment). PT discussed case with RNCM, patient has active DC orders in and will be returning to SNF he presents from. No PT consult needed. PT will hold consult, and keep patient on caseload. Should patient not discharge PT will attempt mobility evaluation as needed.   Kerman Passey, PT, DPT    06/02/2015, 3:25 PM

## 2015-06-02 NOTE — Progress Notes (Addendum)
A fib. Room air. Takes meds ok. HOH. Wife at the bedside. Pt has no further concerns at this time. Up to chair and tolerated it well. Pt tolerating diet well. Pt has no further concerns at this time. IV and tele removed. Discharge instructions given to pt family. Report called to [facility].

## 2015-06-02 NOTE — Care Management (Signed)
Discussed continued observation status with attending and work up for masses could be performed as outpatient. Patient started on IV lasix bid 11/23. Patient's 02 sats stable. No prns meds or change in meds for blood pressure. Discussed the need to get patient out of bed with primary nurse. Informed during progression that oncology saw patient this morning.  Documentation of assessment is not present at this time

## 2015-06-02 NOTE — Discharge Instructions (Signed)
Pleural Effusion °A pleural effusion is an abnormal buildup of fluid in the layers of tissue between your lungs and the inside of your chest (pleural space). These two layers of tissue that line both your lungs and the inside of your chest are called pleura. Usually, there is no air in the space between the pleura, only a thin layer of fluid. If left untreated, a large amount of fluid can build up and cause the lung to collapse. A pleural effusion is usually caused by another disease that requires treatment. °The two main types of pleural effusion are: °· Transudative pleural effusion. This happens when fluid leaks into the pleural space because of a low protein count in your blood or high blood pressure in your vessels. Heart failure often causes this. °· Exudative infusion. This occurs when fluid collects in the pleural space from blocked blood vessels or lymph vessels. Some lung diseases, injuries, and cancers can cause this type of effusion. °CAUSES °Pleural effusion can be caused by: °· Heart failure. °· A blood clot in the lung (pulmonary embolism). °· Pneumonia. °· Cancer. °· Liver failure (cirrhosis). °· Kidney disease. °· Complications from surgery, such as from open heart surgery. °SIGNS AND SYMPTOMS °In some cases, pleural effusion may cause no symptoms. Symptoms can include: °· Shortness of breath, especially when lying down. °· Chest pain, often worse when taking a deep breath. °· Fever. °· Dry cough that is lasting (chronic). °· Hiccups. °· Rapid breathing. °An underlying condition that is causing the pleural effusion (such as heart failure, pneumonia, blood clots, tuberculosis, or cancer) may also cause additional symptoms. °DIAGNOSIS °Your health care provider may suspect pleural effusion based on your symptoms and medical history. Your health care provider will also do a physical exam and a chest X-ray. If the X-ray shows there is fluid in your chest, you may need to have this fluid removed using a  needle (thoracentesis) so it can be tested. °You may also have: °· Imaging studies of the chest, such as: °¨ Ultrasound. °¨ CT scan. °· Blood tests for kidney and liver function. °TREATMENT °Treatment depends on the cause of the pleural effusion. Treatment may include: °· Taking antibiotic medicines to clear up an infection that is causing the pleural effusion. °· Placing a tube in the chest to drain the effusion (tube thoracostomy). This procedure is often used when there is an infection in the fluid. °· Surgery to remove the fibrous outer layer of tissue from the pleural space (decortication). °· Thoracentesis, which can improve cough and shortness of breath. °· A procedure to put medicine into the chest cavity to seal the pleural space to prevent fluid buildup (pleurodesis). °· Chemotherapy and radiation therapy. These may be required in the case of cancerous (malignant) pleural effusion. °HOME CARE INSTRUCTIONS °· Take medicines only as directed by your health care provider. °· Keep track of how long you can gently exercise before you get short of breath. Try simply walking at first. °· Do not use any tobacco products, including cigarettes, chewing tobacco, or electronic cigarettes. If you need help quitting, ask your health care provider. °· Keep all follow-up visits as directed by your health care provider. This is important. °SEEK MEDICAL CARE IF: °· The amount of time that you are able to exercise decreases or does not improve with time. °· You have pain or signs of infection at the puncture site if you had thoracentesis. Watch for: °¨ Drainage. °¨ Redness. °¨ Swelling. °· You have a fever. °  SEEK IMMEDIATE MEDICAL CARE IF: °· You are short of breath. °· You develop chest pain. °· You develop a new cough. °MAKE SURE YOU: °· Understand these instructions. °· Will watch your condition. °· Will get help right away if you are not doing well or get worse. °  °This information is not intended to replace advice  given to you by your health care provider. Make sure you discuss any questions you have with your health care provider. °  °Document Released: 06/24/2005 Document Revised: 07/15/2014 Document Reviewed: 11/17/2013 °[employer]vier Interactive Patient Education ©2016 [employer] ° °

## 2015-06-02 NOTE — Discharge Summary (Signed)
[facility] NAME: Mason Allen    MR#:  [id number]  DATE OF BIRTH:  1920-12-15  DATE OF ADMISSION:  05/29/2015 ADMITTING PHYSICIAN: Harrie Foreman, MD  DATE OF DISCHARGE: 06/02/2015  PRIMARY CARE PHYSICIAN: Frazier Richards MD    ADMISSION DIAGNOSIS:  Hypertensive urgency [I16.0] Chest pain, unspecified chest pain type [R07.9]  DISCHARGE DIAGNOSIS:  Active Problems:   Chest pain  right upper lung mass and bilateral pleural effusion Hypertensive emergency Coronary artery disease Chronic atrial fibrillation Hypokalemia SECONDARY DIAGNOSIS:   Past Medical History  Diagnosis Date  . Atrial fibrillation, unspecified   . Contusion of left hip   . Recurrent falls   . Muscle weakness (generalized)   . Atherosclerotic heart disease of native coronary artery without angina pectoris   . Hypertension   . Primary open angle glaucoma of left eye, severe stage   . Blindness of left eye   . COPD (chronic obstructive pulmonary disease) ([place])   . Hyperlipidemia     HOSPITAL COURSE:  81 year old male admitted for hypertensive urgency and shortness of breath.  Please see Dr. Tinnie Gens dictated history and physical for further details.  Patient was noted to have  1. Right upper lung mass and bilateral pleural effusions: s/p thoracentesis on 11/22, with 700 cc fluid removal.  Waiting for pleural fluid cytology to evaluate for possible malignancy  2. Hypertensive urgency: Blood pressure under much better control at this time. Continue isosorbide, lisinopril, metoprolol, lisinopril  Pulmonary and oncology consultation was obtained.  Oncologist have Discussed both lung nodule with associated pleural effusion as well as mass near splenic hilum. If pleural fluid malignant and secondary to lung cancer, patient would have stage IV disease. Unclear if the cytology will be malignant or not. Unclear if the mass near the splenic  hilum is related or not (benign versus malignant). Discuss additional studies (abdomen/pelvic CT without contrast given patient's allergy to contrast dye) or PET scan as an outpatient once pathology available. Discuss potential work-up in the outpatient department. 2. Discuss possible stereotactic radiosurgery to isolated lung nodule if patient has localized lung cancer. I do not feel that the patient is a surgical candidate. His wife agrees.  3. Patient states that if he has metastatic disease and cannot be cured with treatment, then he does not want treatment.  Patient and family are agreeable with the discharge plan at this time and being discharged in stable condition  DISCHARGE CONDITIONS:  Stable CONSULTS OBTAINED:  Treatment Team:  Isaias Cowman, MD Wilhelmina Mcardle, MD Lequita Asal, MD  DRUG ALLERGIES:   Allergies  Allergen Reactions  . Adenosine Other (See Comments)    Reaction:  Unknown   . Hydrochlorothiazide Other (See Comments)    Reaction:  Unknown   . Ivp Dye [Iodinated Diagnostic Agents] Other (See Comments)    Reaction:  Unknown   . Penicillins Other (See Comments)    Reaction:  Unknown   . Tramadol Other (See Comments)    Reaction:  Unknown     DISCHARGE MEDICATIONS:   Current Discharge Medication List    START taking these medications   Details  isosorbide mononitrate (IMDUR) 30 MG 24 hr tablet Take 1 tablet (30 mg total) by mouth daily. Qty: 30 tablet, Refills: 0      CONTINUE these medications which have NOT CHANGED   Details  aspirin EC 81 MG tablet Take 81 mg by mouth daily.    bimatoprost (LUMIGAN)  0.01 % SOLN Place 1 drop into both eyes at bedtime.    brimonidine (ALPHAGAN) 0.2 % ophthalmic solution Place 1 drop into the right eye 3 (three) times daily.    cholecalciferol (VITAMIN D) 1000 UNITS tablet Take 1,000 Units by mouth daily.    cloNIDine (CATAPRES) 0.1 MG tablet Take 0.1 mg by mouth once.    dorzolamide-timolol  (COSOPT) 22.3-6.8 MG/ML ophthalmic solution Place 1 drop into the right eye 2 (two) times daily.    HYDROcodone-acetaminophen (NORCO/VICODIN) 5-325 MG tablet Take 1 tablet by mouth every 4 (four) hours as needed for moderate pain.    lactulose (CHRONULAC) 10 GM/15ML solution Take 20 g by mouth 2 (two) times daily.    lidocaine (LIDODERM) 5 % Place 1 patch onto the skin daily. Remove & Discard patch within 12 hours or as directed by MD    lisinopril (PRINIVIL,ZESTRIL) 20 MG tablet Take 20 mg by mouth daily.    Menthol-Methyl Salicylate (THERA-GESIC) 1-15 % CREA Apply 1 application topically 4 (four) times daily as needed (for pain).    metoprolol succinate (TOPROL-XL) 100 MG 24 hr tablet Take 100 mg by mouth daily.    simvastatin (ZOCOR) 20 MG tablet Take 20 mg by mouth at bedtime.         DISCHARGE INSTRUCTIONS:   DIET:  Cardiac diet  DISCHARGE CONDITION:  Good  ACTIVITY:  Activity as tolerated  OXYGEN:  Home Oxygen: No.   Oxygen Delivery: room air  DISCHARGE LOCATION:  nursing home   If you experience worsening of your admission symptoms, develop shortness of breath, life threatening emergency, suicidal or homicidal thoughts you must seek medical attention immediately by calling 911 or calling your MD immediately  if symptoms less severe.  You Must read complete instructions/literature along with all the possible adverse reactions/side effects for all the Medicines you take and that have been prescribed to you. Take any new Medicines after you have completely understood and accpet all the possible adverse reactions/side effects.   Please note  You were cared for by a hospitalist during your hospital stay. If you have any questions about your discharge medications or the care you received while you were in the hospital after you are discharged, you can call the unit and asked to speak with the hospitalist on call if the hospitalist that took care of you is not available.  Once you are discharged, your primary care physician will handle any further medical issues. Please note that NO REFILLS for any discharge medications will be authorized once you are discharged, as it is imperative that you return to your primary care physician (or establish a relationship with a primary care physician if you do not have one) for your aftercare needs so that they can reassess your need for medications and monitor your lab values.    On the day of Discharge:   VITAL SIGNS:  Blood pressure 137/79, pulse 70, temperature 97.6 F (36.4 C), temperature source Oral, resp. rate 18, height 5\' 10"  (1.778 m), weight 68.312 kg (150 lb 9.6 oz), SpO2 94 %. PHYSICAL EXAMINATION:  GENERAL:  81 y.o.-year-old patient lying in the bed with no acute distress.  EYES: Pupils equal, round, reactive to light and accommodation. No scleral icterus. Extraocular muscles intact.  HEENT: Head atraumatic, normocephalic. Oropharynx and nasopharynx clear.  NECK:  Supple, no jugular venous distention. No thyroid enlargement, no tenderness.  LUNGS: Normal breath sounds bilaterally, no wheezing, rales,rhonchi or crepitation. No use of accessory muscles of respiration.  CARDIOVASCULAR: S1, S2 normal. No murmurs, rubs, or gallops.  ABDOMEN: Soft, non-tender, non-distended. Bowel sounds present. No organomegaly or mass.  EXTREMITIES: No pedal edema, cyanosis, or clubbing.  NEUROLOGIC: Cranial nerves II through XII are intact. Muscle strength 5/5 in all extremities. Sensation intact. Gait not checked.  PSYCHIATRIC: The patient is alert and oriented x 3.  SKIN: No obvious rash, lesion, or ulcer.  DATA REVIEW:   CBC  Recent Labs Lab 06/02/15 0512  WBC 9.5  HGB 14.1  HCT 43.4  PLT 191    Chemistries   Recent Labs Lab 06/02/15 0512  NA 144  K 3.1*  CL 105  CO2 33*  GLUCOSE 99  BUN 26*  CREATININE 1.00  CALCIUM 9.9  MG 1.8    Management plans discussed with the patient, family and they are in  agreement.  CODE STATUS: DO NOT RESUSCITATE   TOTAL TIME TAKING CARE OF THIS PATIENT: 55 minutes.    [facility], Rosamaria Donn M.D on 06/02/2015 at 2:58 PM  Between 7am to 6pm - Pager - [phone]  After 6pm go to [email or web address] - password [email or web address] [facility]  [facility]  Office  [phone]  CC: Primary care physician; Frazier Richards MD Nolon Stalls MD

## 2015-06-02 NOTE — Clinical Social Work Note (Signed)
Patient to discharge to return to [facility] today. Patient's family coming out of patient's room today is aware and will transport patient back to [place]. Kim at [facility] notified of this and the request by family to have someone at [facility] to get patient from car to inside.Discharge information sent via Epic. Shela Leff MSW,LCSW [phone]

## 2015-06-03 LAB — BODY FLUID CULTURE: CULTURE: NO GROWTH

## 2015-06-04 LAB — COMP PANEL: LEUKEMIA/LYMPHOMA

## 2015-06-04 LAB — PH, BODY FLUID: PH, BODY FLUID: 8.1

## 2015-06-05 LAB — CYTOLOGY - NON PAP

## 2015-06-06 LAB — CHOLESTEROL, BODY FLUID: Cholesterol, Fluid: 25 mg/dL

## 2015-06-06 LAB — TRIGLYCERIDES, BODY FLUIDS: Triglycerides, Fluid: 12 mg/dL

## 2015-06-08 ENCOUNTER — Telehealth: Payer: Self-pay

## 2015-06-08 ENCOUNTER — Encounter
Admission: RE | Admit: 2015-06-08 | Discharge: 2015-06-08 | Disposition: A | Discharge status: Home / Self Care | Payer: Medicare Other | Source: Ambulatory Visit | Attending: Internal Medicine | Admitting: Internal Medicine

## 2015-06-12 ENCOUNTER — Telehealth: Payer: Self-pay | Admitting: Hematology and Oncology

## 2015-06-12 ENCOUNTER — Other Ambulatory Visit: Payer: Self-pay | Admitting: Hematology and Oncology

## 2015-06-12 ENCOUNTER — Telehealth: Payer: Self-pay | Admitting: *Deleted

## 2015-06-12 DIAGNOSIS — D381 Neoplasm of uncertain behavior of trachea, bronchus and lung: Secondary | ICD-10-CM | POA: Insufficient documentation

## 2015-06-12 DIAGNOSIS — J90 Pleural effusion, not elsewhere classified: Secondary | ICD-10-CM | POA: Insufficient documentation

## 2015-06-12 DIAGNOSIS — R1902 Left upper quadrant abdominal swelling, mass and lump: Secondary | ICD-10-CM

## 2015-06-12 NOTE — Telephone Encounter (Signed)
Re:  PET scan  Patient called regarding PET scan before follow-up in clinic.  Spoke with daughter.  Scan ordered. Patient will be seen in follow-up in clinic after his scan.  Lequita Asal, MD

## 2015-06-12 NOTE — Telephone Encounter (Signed)
States that he was told he will need a PET scan before he sees Dr Mike Gip. He has an appt on 12/12. Does he need a PET?

## 2015-06-13 NOTE — Telephone Encounter (Signed)
  Spoke with daughter.  Ordered PET prior to follow-up.  M

## 2015-06-15 ENCOUNTER — Ambulatory Visit
Admission: RE | Admit: 2015-06-15 | Discharge: 2015-06-15 | Disposition: A | Discharge status: Home / Self Care | Payer: No Typology Code available for payment source | Source: Ambulatory Visit | Attending: Hematology and Oncology | Admitting: Hematology and Oncology

## 2015-06-15 DIAGNOSIS — D381 Neoplasm of uncertain behavior of trachea, bronchus and lung: Secondary | ICD-10-CM

## 2015-06-15 DIAGNOSIS — R918 Other nonspecific abnormal finding of lung field: Secondary | ICD-10-CM | POA: Insufficient documentation

## 2015-06-15 DIAGNOSIS — Z0189 Encounter for other specified special examinations: Secondary | ICD-10-CM | POA: Insufficient documentation

## 2015-06-15 DIAGNOSIS — J9811 Atelectasis: Secondary | ICD-10-CM | POA: Insufficient documentation

## 2015-06-15 DIAGNOSIS — I700 Atherosclerosis of aorta: Secondary | ICD-10-CM | POA: Insufficient documentation

## 2015-06-15 DIAGNOSIS — J90 Pleural effusion, not elsewhere classified: Secondary | ICD-10-CM | POA: Insufficient documentation

## 2015-06-15 LAB — GLUCOSE, CAPILLARY: Glucose-Capillary: 85 mg/dL (ref 65–99)

## 2015-06-15 MED ORDER — FLUDEOXYGLUCOSE F - 18 (FDG) INJECTION
12.4600 | Freq: Once | INTRAVENOUS | Status: AC | PRN
  Administered 2015-06-15: 12.46 via INTRAVENOUS

## 2015-06-19 ENCOUNTER — Inpatient Hospital Stay: Payer: Medicare Other | Attending: Hematology and Oncology | Admitting: Hematology and Oncology

## 2015-06-19 VITALS — BP 163/82 | HR 88 | Temp 97.9°F | Resp 18 | Ht 70.0 in

## 2015-06-19 DIAGNOSIS — J449 Chronic obstructive pulmonary disease, unspecified: Secondary | ICD-10-CM | POA: Insufficient documentation

## 2015-06-19 DIAGNOSIS — R918 Other nonspecific abnormal finding of lung field: Secondary | ICD-10-CM | POA: Insufficient documentation

## 2015-06-19 DIAGNOSIS — Z87891 Personal history of nicotine dependence: Secondary | ICD-10-CM | POA: Diagnosis not present

## 2015-06-19 DIAGNOSIS — I10 Essential (primary) hypertension: Secondary | ICD-10-CM | POA: Diagnosis not present

## 2015-06-19 DIAGNOSIS — Z79899 Other long term (current) drug therapy: Secondary | ICD-10-CM | POA: Diagnosis not present

## 2015-06-19 DIAGNOSIS — I4891 Unspecified atrial fibrillation: Secondary | ICD-10-CM | POA: Insufficient documentation

## 2015-06-19 DIAGNOSIS — D381 Neoplasm of uncertain behavior of trachea, bronchus and lung: Secondary | ICD-10-CM

## 2015-06-19 DIAGNOSIS — J90 Pleural effusion, not elsewhere classified: Secondary | ICD-10-CM

## 2015-06-19 DIAGNOSIS — R0602 Shortness of breath: Secondary | ICD-10-CM | POA: Insufficient documentation

## 2015-06-19 NOTE — Progress Notes (Signed)
[facility] day:  06/19/2015  Chief Complaint: Mason Allen is a 81 y.o. male with a right upper lobe lung mass who is seen for assessment after recent hospitalization.  HPI:  The patient was admitted to [facility] from 05/29/2015 - 06/02/2015 with shortness of breath, chest pain, and a hypertensive urgency.  Imaging studies revealed small to moderate bilateral pleural effusions (larger on the right) and a 2.1 cm nodule near the right lung apex worrisome for primary bronchogenic carcinoma. There was a 3.3 cm solid mass inferior to the splenic hilum.   He underwent right sided thoracentesis on 05/30/2015. 700 cc of clear fluid was removed. Cytology was negative. White blood cell count was 2018 with 3% neutrophils, 60% lymphocytes, and 37% monocytes-macrophages. LDH was 102 (3-23). Protein was less than 3 g/dL. Cultures were negative.  He was seen in consultation.  We discussed outpatient PET scan and biopsy.  PET scan on 06/15/2015 revealed a 1.6 cm hypermetabolic right upper lobe pulmonary nodule consistent with neoplasm.  There was no mediastinal or hilar lymphadenopathy or metastatic disease.  There was persistent bilateral pleural effusions and overlying atelectasis.  There was advanced atherosclerotic calcifications involving the aorta and branch vessels.  There were probable complex cysts associated with the left kidney.  Symptomatically, he he feels "so-so".  He notes that his low back bothers him. He is currently residing at a skilled nursing, [place]. He states that the physical therapy helps him to move around more. He states he gets a lot of help. He does no independent walking. He needs assistance with dressing, bathing, and showering.  He spends most of his day in a chair at rest.  He notes shortness of breath which comes and goes. He denies any cough.   Past Medical History  Diagnosis Date  . Atrial fibrillation, unspecified    . Contusion of left hip   . Recurrent falls   . Muscle weakness (generalized)   . Atherosclerotic heart disease of native coronary artery without angina pectoris   . Hypertension   . Primary open angle glaucoma of left eye, severe stage   . Blindness of left eye   . COPD (chronic obstructive pulmonary disease) ([place])   . Hyperlipidemia     Past Surgical History  Procedure Laterality Date  . Cholecystectomy    . Transurethral resection of prostate    . Coronary angioplasty with stent placement    . Carotid stent      Family History  Problem Relation Age of Onset  . Hypertension      Social History:  reports that he has quit smoking. He does not have any smokeless tobacco history on file. He reports that he drinks about 0.6 oz of alcohol per week. His drug history is not on file.  He is residing at [facility] at [place].  The patient is accompanied by his son, Linna Hoff, and his wife today.  Allergies:  Allergies  Allergen Reactions  . Adenosine Other (See Comments)    Reaction:  Unknown   . Hydrochlorothiazide Other (See Comments)    Reaction:  Unknown   . Ivp Dye [Iodinated Diagnostic Agents] Other (See Comments)    Reaction:  Unknown   . Penicillins Other (See Comments)    Reaction:  Unknown   . Tramadol Other (See Comments)    Reaction:  Unknown     Current Medications: Current Outpatient Prescriptions  Medication Sig Dispense Refill  . aspirin EC  81 MG tablet Take 81 mg by mouth daily.    . bimatoprost (LUMIGAN) 0.01 % SOLN Place 1 drop into both eyes at bedtime.    . brimonidine (ALPHAGAN) 0.2 % ophthalmic solution Place 1 drop into the right eye 3 (three) times daily.    . cholecalciferol (VITAMIN D) 1000 UNITS tablet Take 1,000 Units by mouth daily.    . cloNIDine (CATAPRES) 0.1 MG tablet Take 0.1 mg by mouth once.    . dorzolamide-timolol (COSOPT) 22.3-6.8 MG/ML ophthalmic solution Place 1 drop into the right eye 2 (two) times daily.    Marland Kitchen  HYDROcodone-acetaminophen (NORCO/VICODIN) 5-325 MG tablet Take 1 tablet by mouth every 4 (four) hours as needed for moderate pain.    . isosorbide mononitrate (IMDUR) 30 MG 24 hr tablet Take 1 tablet (30 mg total) by mouth daily. 30 tablet 0  . lactulose (CHRONULAC) 10 GM/15ML solution Take 20 g by mouth 2 (two) times daily.    Marland Kitchen lidocaine (LIDODERM) 5 % Place 1 patch onto the skin daily. Remove & Discard patch within 12 hours or as directed by MD    . lisinopril (PRINIVIL,ZESTRIL) 20 MG tablet Take 20 mg by mouth daily.    . Menthol-Methyl Salicylate (THERA-GESIC) 1-15 % CREA Apply 1 application topically 4 (four) times daily as needed (for pain).    . metoprolol succinate (TOPROL-XL) 100 MG 24 hr tablet Take 100 mg by mouth daily.    . simvastatin (ZOCOR) 20 MG tablet Take 20 mg by mouth at bedtime.     No current facility-administered medications for this visit.    Review of Systems:  GENERAL:  Fatigue.  Feels "so-so".  Spends most of day resting/sitting in a chair.  No fevers, sweats or weight loss. PERFORMANCE STATUS (ECOG):  3 HEENT:  No visual changes, runny nose, sore throat, mouth sores or tenderness. Lungs: Shortness of breath which comes and goes.  No cough.  No hemoptysis. Cardiac:  No chest pain, palpitations, orthopnea, or PND. GI:  Eating so-so.  Drinks 1 can of Ensure a day.  No nausea, vomiting, diarrhea, constipation, melena or hematochezia. GU:  No urgency, frequency, dysuria, or hematuria. Musculoskeletal:  Chronic back pain.  Degenerative disk disease.  No joint pain.  No muscle tenderness. Extremities:  No pain or swelling. Skin:  No rashes or skin changes. Neuro:  No headache, numbness or weakness, balance or coordination issues. Endocrine:  No diabetes, thyroid issues, hot flashes or night sweats. Psych:  No mood changes, depression or anxiety. Pain:  No focal pain. Review of systems:  All other systems reviewed and found to be negative.  Physical Exam: Blood  pressure 163/82, pulse 88, temperature 97.9 F (36.6 C), temperature source Tympanic, resp. rate 18, height 5\' 10"  (1.778 m), SpO2 95 %. GENERAL:  Well developed, well nourished, sitting comfortably in the exam room in no acute distress. MENTAL STATUS:  Alert and oriented to person, place and time. HEAD:  Short gray hair.  Male pattern baldness.  Normocephalic, atraumatic, face symmetric, no Cushingoid features. EYES:  Blue eyes.  Left eye irregular, blind.  Right eye reactive.  No conjunctivitis or scleral icterus. ENT:  Oropharynx clear without lesion.  Tongue normal. Mucous membranes moist.  RESPIRATORY:  Poor respiratory excursion.  Decreased breath sounds at the bases.  Clear to auscultation without rales, wheezes or rhonchi. CARDIOVASCULAR:  Regular rate and rhythm without murmur, rub or gallop. ABDOMEN:  Soft, non-tender, with active bowel sounds, and no hepatosplenomegaly.  No masses. SKIN:  No rashes, ulcers or lesions. EXTREMITIES: No edema, no skin discoloration or tenderness.  No palpable cords. LYMPH NODES: No palpable cervical, supraclavicular, axillary or inguinal adenopathy  NEUROLOGICAL: Unremarkable. PSYCH:  Appropriate.  No visits with results within 3 Day(s) from this visit. Latest known visit with results is:  Hospital Outpatient Visit on 06/15/2015  Component Date Value Ref Range Status  . Glucose-Capillary 06/15/2015 85  65 - 99 mg/dL Final    Assessment:  Mason Allen is a 81 y.o. male with probable early stage lung cancer.  He has a 30 pack year smoking history, chronic shortness of breath, and a 20 pound weight loss.  Initial imaging studies on 05/29/2015 revealed small to moderate bilateral pleural effusions (right greater than left) and a 2.1 cm nodule near the right lung apex worrisome for primary bronchogenic carcinoma.  There was a 3.3 cm solid mass inferior to the splenic hilum.  Right sided thoracentesis on 05/30/2015 revealed no evidence of malignancy.  Cultures were negative.  PET scan on 06/15/2015 revealed a 1.6 cm hypermetabolic right upper lobe pulmonary nodule consistent with neoplasm.  There was no mediastinal or hilar lymphadenopathy or evidence of metastatic disease.  Symptomatically, his performance status is poor (ECOG 3). He is currently residing at the [facility].  Plan: 1.  Review work-up in hospital and recent PET scan.  Discuss likely diagnosis of lung cancer.  Imaging suggests localized/early stage disease.  Discuss surgery consultation.  Given patient's age and comorbidities, do not feel surgery is a good option.  Patient in agreement.  Discussed obtaining CT guided biopsy for diagnosis and likely referral to radiation oncology for stereotactic radiosurgery.  CT guided biopsy discussed in detail. 2.  Schedule CT guided biopsy of RUL nodule. 3.  RTC 2-3 days after biopsy to discuss results.   Lequita Asal, MD  06/19/2015, 3:29 PM

## 2015-06-23 ENCOUNTER — Other Ambulatory Visit: Payer: Self-pay | Admitting: Radiology

## 2015-06-26 ENCOUNTER — Ambulatory Visit
Admission: RE | Admit: 2015-06-26 | Discharge: 2015-06-26 | Disposition: A | Discharge status: Home / Self Care | Payer: Medicare Other | Source: Ambulatory Visit | Attending: Hematology and Oncology | Admitting: Hematology and Oncology

## 2015-06-26 DIAGNOSIS — E785 Hyperlipidemia, unspecified: Secondary | ICD-10-CM | POA: Diagnosis not present

## 2015-06-26 DIAGNOSIS — J449 Chronic obstructive pulmonary disease, unspecified: Secondary | ICD-10-CM | POA: Diagnosis not present

## 2015-06-26 DIAGNOSIS — I4891 Unspecified atrial fibrillation: Secondary | ICD-10-CM | POA: Diagnosis not present

## 2015-06-26 DIAGNOSIS — D381 Neoplasm of uncertain behavior of trachea, bronchus and lung: Secondary | ICD-10-CM | POA: Diagnosis present

## 2015-06-26 DIAGNOSIS — I2510 Atherosclerotic heart disease of native coronary artery without angina pectoris: Secondary | ICD-10-CM | POA: Diagnosis not present

## 2015-06-26 DIAGNOSIS — I10 Essential (primary) hypertension: Secondary | ICD-10-CM | POA: Diagnosis not present

## 2015-06-26 HISTORY — DX: Cardiac arrhythmia, unspecified: I49.9

## 2015-06-26 LAB — APTT: APTT: 31 s (ref 24–36)

## 2015-06-26 LAB — CBC
HEMATOCRIT: 46.6 % (ref 40.0–52.0)
HEMOGLOBIN: 14.9 g/dL (ref 13.0–18.0)
MCH: 30.2 pg (ref 26.0–34.0)
MCHC: 32 g/dL (ref 32.0–36.0)
MCV: 94.4 fL (ref 80.0–100.0)
Platelets: 177 10*3/uL (ref 150–440)
RBC: 4.94 MIL/uL (ref 4.40–5.90)
RDW: 16.5 % — AB (ref 11.5–14.5)
WBC: 8.2 10*3/uL (ref 3.8–10.6)

## 2015-06-26 LAB — PROTIME-INR
INR: 1.19
Prothrombin Time: 15.3 seconds — ABNORMAL HIGH (ref 11.4–15.0)

## 2015-06-26 MED ORDER — SODIUM CHLORIDE 0.9 % IV SOLN: Freq: Once | INTRAVENOUS | Status: DC

## 2015-06-26 NOTE — Consult Note (Signed)
Chief Complaint: Patient was seen in consultation today for CT-guided pulmonary nodule biopsy at the request of [place] [facility]  Referring Physician(s): Corcoran,Melissa C  History of Present Illness: Mason Allen is a 81 y.o. male with past medical history significant for atrial fibrillation, CAD, hyperlipidemia and COPD who was admitted to [facility] on 05/30/2015 for shortness of breath where workup demonstrated an indeterminate approximately 1.7 cm nodule within the peripheral aspect of the right upper lobe. Subsequent PET/CT 06/15/2015 demonstrated this nodule to be hypermetabolic (SUV max of 3.8), without evidence of extrathoracic disease.  Patient presents today for attempted CT-guided right upper lobe nodule biopsy. The patient is accompanied by his wife and daughter though serves as his own historian and provides his own medical decision making.  Given patient's advanced age, he is unlikely to be a surgical candidate. Regardless, the patient is not interested in undergoing surgical resection. Additionally, the patient is not interested in undergoing chemotherapy if this nodule proved to be compatible with lung cancer.  At the present time, the patient is even unsure whether he would wish to pursue with radiation therapy.  The patient is currently without complaint. No cough or hemoptysis. No shortness of breath. No fever or chills.  Past Medical History  Diagnosis Date  . Atrial fibrillation, unspecified   . Contusion of left hip   . Recurrent falls   . Muscle weakness (generalized)   . Atherosclerotic heart disease of native coronary artery without angina pectoris   . Hypertension   . Primary open angle glaucoma of left eye, severe stage   . Blindness of left eye   . COPD (chronic obstructive pulmonary disease) ([place])   . Hyperlipidemia   . Dysrhythmia     Past Surgical History  Procedure Laterality Date  . Cholecystectomy    . Transurethral  resection of prostate    . Coronary angioplasty with stent placement    . Carotid stent      Allergies: Adenosine; Hydrochlorothiazide; Ivp dye; Penicillins; and Tramadol  Medications: Prior to Admission medications   Medication Sig Start Date End Date Taking? Authorizing Provider  aspirin EC 81 MG tablet Take 81 mg by mouth daily.    Historical Provider, MD  bimatoprost (LUMIGAN) 0.01 % SOLN Place 1 drop into both eyes at bedtime.    Historical Provider, MD  brimonidine (ALPHAGAN) 0.2 % ophthalmic solution Place 1 drop into the right eye 3 (three) times daily.    Historical Provider, MD  cholecalciferol (VITAMIN D) 1000 UNITS tablet Take 1,000 Units by mouth daily.    Historical Provider, MD  cloNIDine (CATAPRES) 0.1 MG tablet Take 0.1 mg by mouth once.    Historical Provider, MD  dorzolamide-timolol (COSOPT) 22.3-6.8 MG/ML ophthalmic solution Place 1 drop into the right eye 2 (two) times daily.    Historical Provider, MD  HYDROcodone-acetaminophen (NORCO/VICODIN) 5-325 MG tablet Take 1 tablet by mouth every 4 (four) hours as needed for moderate pain.    Historical Provider, MD  isosorbide mononitrate (IMDUR) 30 MG 24 hr tablet Take 1 tablet (30 mg total) by mouth daily. 06/02/15   Max Sane, MD  lactulose (CHRONULAC) 10 GM/15ML solution Take 20 g by mouth 2 (two) times daily.    Historical Provider, MD  lidocaine (LIDODERM) 5 % Place 1 patch onto the skin daily. Remove & Discard patch within 12 hours or as directed by MD    Historical Provider, MD  lisinopril (PRINIVIL,ZESTRIL) 20 MG tablet Take 20 mg by  mouth daily.    Historical Provider, MD  Menthol-Methyl Salicylate (THERA-GESIC) 1-15 % CREA Apply 1 application topically 4 (four) times daily as needed (for pain).    Historical Provider, MD  metoprolol succinate (TOPROL-XL) 100 MG 24 hr tablet Take 100 mg by mouth daily.    Historical Provider, MD  simvastatin (ZOCOR) 20 MG tablet Take 20 mg by mouth at bedtime.    Historical Provider,  MD     Family History  Problem Relation Age of Onset  . Hypertension      Social History   Social History  . Marital Status: Married    Spouse Name: N/A  . Number of Children: N/A  . Years of Education: N/A   Social History Main Topics  . Smoking status: Former Research scientist (life sciences)  . Smokeless tobacco: Never Used  . Alcohol Use: 0.6 oz/week    1 Cans of beer per week  . Drug Use: No  . Sexual Activity: Not Asked   Other Topics Concern  . None   Social History Narrative    ECOG Status: 2 - Symptomatic, <50% confined to bed  Review of Systems: A 12 point ROS discussed and pertinent positives are indicated in the HPI above.  All other systems are negative.  Review of Systems  Vital Signs: BP 161/100 mmHg  Pulse 78  Temp(Src) 97.7 F (36.5 C) (Oral)  Resp 18  Ht 5\' 4"  (1.626 m)  Wt 160 lb (72.576 kg)  BMI 27.45 kg/m2  SpO2 97%  Physical Exam   Imaging: Dg Chest 1 View  05/30/2015  CLINICAL DATA:  Right thoracentesis EXAM: CHEST 1 VIEW COMPARISON:  Yesterday FINDINGS: There is no pneumothorax after right thoracentesis. Right pleural effusion has resolved. Right upper lobe lung nodule persists. Cardiomegaly is stable. Small left pleural effusion is stable. IMPRESSION: No pneumothorax. Electronically Signed   By: Marybelle Killings M.D.   On: 05/30/2015 14:45   Dg Chest 1 View  05/29/2015  CLINICAL DATA:  Shortness of breath. Chest pain. Hypertension. Symptoms occurred tonight. EXAM: CHEST 1 VIEW COMPARISON:  None. FINDINGS: Atherosclerotic aortic arch. Borderline enlargement of the cardiopericardial silhouette mild obscuration of the left hemidiaphragm medially in the retrocardiac region. There is some calcifications in the right mid lung and right hilum indicative of old granulomatous disease. However, there is also a somewhat irregular 2.3 by 1.6 cm right upper lobe nodule which is not definitively calcified. The Mild upper zone pulmonary vascular prominence. IMPRESSION: 1. 2.3 by  1.6 cm right upper lobe nodule. The possibility of lung cancer is raised. Chest CT is recommended for further workup. If the patient's creatinine allows, pulmonary embolus protocol chest CTA could be utilized but to workup this nodule and rule out underlying vascular abnormalities. 2. Native retrocardiac density possibly from atelectasis or pneumonia. 3. Old granulomatous disease 4. Atherosclerotic aortic arch. Electronically Signed   By: Van Clines M.D.   On: 05/29/2015 22:11   Ct Chest Wo Contrast  05/29/2015  CLINICAL DATA:  Acute onset of shortness of breath and generalized chest pain. Wheezing. High blood pressure. Initial encounter. EXAM: CT CHEST WITHOUT CONTRAST TECHNIQUE: Multidetector CT imaging of the chest was performed following the standard protocol without IV contrast. COMPARISON:  Chest radiograph performed earlier today at 9:50 p.m. FINDINGS: Small to moderate bilateral pleural effusions are noted, slightly larger on the right. Scattered calcified granulomata are noted within the collapsed portions of the right lower lobe. A 2.1 cm nodule is noted near the right lung apex, concerning  for primary malignancy. A prominent calcified granuloma is noted within the right middle lobe. No pneumothorax is seen. Trace pericardial fluid remains within normal limits. Diffuse coronary artery calcifications are seen. Diffuse calcification is noted along the aortic arch and proximal great vessels. The thyroid gland is unremarkable in appearance. No axillary lymphadenopathy is seen. Small bilateral hypodensities in the liver are nonspecific but may reflect small cysts. Numerous calcified granulomata are seen within the spleen. Diffuse calcification is noted along the proximal abdominal aorta and its branches. There appears to be a 3.3 cm solid mass inferior to the splenic hilum, which may reflect a renal mass, though the left kidney is not imaged on this study. No acute osseous abnormalities are seen.  IMPRESSION: 1. Small to moderate bilateral pleural effusions, slightly larger on the right. Underlying scattered calcified granulomata within the collapsed portions of the right lower lobe. 2. 2.1 cm nodule near the right lung apex, concerning for primary bronchogenic malignancy. 3. Apparent 3.3 cm solid mass inferior to the splenic hilum. The left kidney is not imaged on this study, but this may reflect a renal mass. CT of the abdomen and pelvis could be considered for further evaluation, on an elective nonemergent basis, if deemed clinically appropriate. 4. Diffuse coronary artery calcifications seen. 5. Scattered small bilateral hypodensities within the liver are nonspecific but may reflect small cysts. 6. Diffuse calcification along the proximal abdominal aorta and its branches. Electronically Signed   By: Garald Balding M.D.   On: 05/29/2015 23:57   Nm Pet Image Initial (pi) Skull Base To Thigh  06/15/2015  CLINICAL DATA:  Initial Treatment strategy for right upper lobe pulmonary nodule. EXAM: NUCLEAR MEDICINE PET SKULL BASE TO THIGH TECHNIQUE: 12.46 mCi F-18 FDG was injected intravenously. Full-ring PET imaging was performed from the skull base to thigh after the radiotracer. CT data was obtained and used for attenuation correction and anatomic localization. FASTING BLOOD GLUCOSE:  Value: 85 mg/dl COMPARISON:  Chest CT 05/29/2015 FINDINGS: NECK No hypermetabolic lymph nodes in the neck. CHEST The 16 mm right upper lobe pulmonary nodule is hypermetabolic with SUV max of 3.8. This is consistent with primary lung neoplasm. No hypermetabolic mediastinal or hilar lymph nodes. Benign calcified granuloma in the right middle lobe. Extensive atherosclerotic calcification involving the thoracic aorta and coronary arteries. Bilateral pleural effusions and overlying atelectasis. ABDOMEN/PELVIS No abnormal hypermetabolic activity within the liver, pancreas, adrenal glands, or spleen. No hypermetabolic lymph nodes in the  abdomen or pelvis. Stable calcified granulomas in the spleen and small hepatic cysts. Bilateral renal calculi and renal cysts. Upper pole left renal cysts is likely complicated by hemorrhage measuring 22 Hounsfield units. Small lower pole left renal lesion projecting medially measures 48 Hounsfield units and is most likely a hemorrhagic cyst. Advanced atherosclerotic calcifications involving the aorta and branch vessels. Infrarenal abdominal aortic aneurysm measuring a maximum of 2.95 cm SKELETON No findings to suggest metastatic bone disease. Right-sided trochanteric bursitis is noted. Extravasation at the injection site in the left antecubital fossa is noted. IMPRESSION: 1. Hypermetabolic 16 mm right upper lobe pulmonary nodule consistent with neoplasm. No mediastinal or hilar lymphadenopathy or metastatic disease. 2. Persistent bilateral pleural effusions and overlying atelectasis. 3. Advanced atherosclerotic calcifications involving the aorta and branch vessels. 4. Probable complex cysts associated with the left kidney. Recommend attention on future scans. Electronically Signed   By: Marijo Sanes M.D.   On: 06/15/2015 13:28   Dg Chest Port 1 View  06/02/2015  CLINICAL DATA:  Pleural effusion.  EXAM: PORTABLE CHEST 1 VIEW COMPARISON:  Chest radiograph of May 30, 2015. CT scan of May 29, 2015. FINDINGS: Stable cardiomediastinal silhouette. No pneumothorax is noted. No significant pleural effusion is noted. Probable minimal left basilar subsegmental atelectasis is noted. Stable calcified granuloma seen in right midlung. Stable nodular density is noted laterally in right upper lobe concerning for neoplasm or malignancy. Bony thorax is unremarkable. IMPRESSION: Minimal left basilar subsegmental atelectasis. Stable right upper lobe nodule is noted concerning for primary malignancy as described on previous CT scan. Electronically Signed   By: Marijo Conception, M.D.   On: 06/02/2015 12:50   US  Thoracentesis Asp Pleural Space W/img Guide  05/30/2015  CLINICAL DATA:  Right pleural effusion EXAM: ULTRASOUND GUIDED RIGHT THORACENTESIS COMPARISON:  None. PROCEDURE: An ultrasound guided thoracentesis was thoroughly discussed with the patient and questions answered. The benefits, risks, alternatives and complications were also discussed. The patient understands and wishes to proceed with the procedure. Written consent was obtained. Ultrasound was performed to localize and mark an adequate pocket of fluid in the right chest. The area was then prepped and draped in the normal sterile fashion. 1% Lidocaine was used for local anesthesia. Under ultrasound guidance a Safe-T-Centesis catheter was introduced. Thoracentesis was performed. The catheter was removed and a dressing applied. COMPLICATIONS: None. FINDINGS: A total of approximately 700 cc of clear fluid was removed. A fluid sample was not sent for laboratory analysis. IMPRESSION: Successful ultrasound guided right thoracentesis yielding 700 cc of pleural fluid. Electronically Signed   By: Marybelle Killings M.D.   On: 05/30/2015 14:46    Labs:  CBC:  Recent Labs  05/31/15 0417 06/01/15 0731 06/02/15 0512 06/26/15 0853  WBC 9.0 8.5 9.5 8.2  HGB 13.2 14.7 14.1 14.9  HCT 40.0 44.3 43.4 46.6  PLT 174 181 191 177    COAGS:  Recent Labs  06/26/15 0853  INR 1.19  APTT 31    BMP:  Recent Labs  05/29/15 2152 05/31/15 0417 06/01/15 0731 06/02/15 0512  NA 142 147* 145 144  K 4.1 3.8 3.3* 3.1*  CL 108 109 105 105  CO2 30 32 33* 33*  GLUCOSE 105* 96 99 99  BUN 26* 27* 21* 26*  CALCIUM 10.1 10.0 10.2 9.9  CREATININE 0.72 1.08 0.86 1.00  GFRNONAA >60 58* >60 >60  GFRAA >60 >60 >60 >60    LIVER FUNCTION TESTS: No results for input(s): BILITOT, AST, ALT, ALKPHOS, PROT, ALBUMIN in the last 8760 hours.  TUMOR MARKERS: No results for input(s): AFPTM, CEA, CA199, CHROMGRNA in the last 8760 hours.  Assessment and Plan:  Mason Allen is a 81 y.o. male with past medical history significant for atrial fibrillation, CAD, hyperlipidemia and COPD who was admitted to [facility] on 05/30/2015 for shortness of breath where workup demonstrated an indeterminate approximately 1.7 cm nodule within the peripheral aspect of the right upper lobe. Subsequent PET/CT 06/15/2015 demonstrated this nodule to be hypermetabolic (SUV max of 3.8), without evidence of extrathoracic disease.  The patient is not interested in a surgical resection, nor is he interested in undergoing chemotherapy if this nodule proved to be compatible with lung cancer.  At the present time, the patient is even unsure whether he would wish to pursue with radiation therapy.  Risks and benefits of CT-guided lung biopsy were discussed with the patient including, but not limited to bleeding, hemoptysis, respiratory failure requiring intubation, infection, pneumothorax requiring chest tube placement, stroke from air embolism or even  death.    Prolonged conversations were held with the patient and the patient's family regarding the appropriateness of proceeding with a CT-guided lung biopsy if the patient ultimately does not desire treatment.  After this prolonged conversation, the patient, as well as his family, wishes to at least postpone the CT-guided lung biopsy until after the holidays.  They wish to discuss expected outcomes of radiation therapy if this nodule were to represent stage I lung cancer, as well as if this nodule could be prophylactically radiated without subjecting the patient to a CT-guided lung biopsy.  Based on the detailed discussion I had with the patient and the patient's family, my feeling is that they will ultimately decide to forego any treatment or surveillance and seek a palliative approach which is quite appropriate given the patient's advanced age.  Above was discussed with referring oncologist Dr. Mike Gip at the time of the  completion of this consultation.  A copy of this report was sent to the requesting provider on this date.  SignedSandi Mariscal 06/26/2015, 10:24 AM   I spent a total of 30 Minutes in face to face in clinical consultation, greater than 50% of which was counseling/coordinating care for CT-guided pulmonary nodule biopsy

## 2015-06-29 ENCOUNTER — Inpatient Hospital Stay (HOSPITAL_BASED_OUTPATIENT_CLINIC_OR_DEPARTMENT_OTHER): Payer: Medicare Other | Admitting: Hematology and Oncology

## 2015-06-29 VITALS — BP 158/74 | HR 66 | Temp 97.1°F | Resp 18 | Ht 64.0 in

## 2015-06-29 DIAGNOSIS — J90 Pleural effusion, not elsewhere classified: Secondary | ICD-10-CM

## 2015-06-29 DIAGNOSIS — R918 Other nonspecific abnormal finding of lung field: Secondary | ICD-10-CM

## 2015-06-29 DIAGNOSIS — R0602 Shortness of breath: Secondary | ICD-10-CM | POA: Diagnosis not present

## 2015-06-29 DIAGNOSIS — D381 Neoplasm of uncertain behavior of trachea, bronchus and lung: Secondary | ICD-10-CM

## 2015-06-29 DIAGNOSIS — Z87891 Personal history of nicotine dependence: Secondary | ICD-10-CM | POA: Diagnosis not present

## 2015-06-29 DIAGNOSIS — Z79899 Other long term (current) drug therapy: Secondary | ICD-10-CM

## 2015-06-29 NOTE — Progress Notes (Signed)
[facility] day:  06/29/2015  Chief Complaint: Mason Allen is a 81 y.o. male the right upper lobe lung mass who is seen for two-week reassessment.  HPI: The patient was last seen in the medical oncology clinic on 06/19/2015.  At that time he was seen for initial assessment after interval hospitalization and PET scan.  PET scan on 06/15/2015 had revealed a 1.6 cm hypermetabolic right upper lobe pulmonary nodule consistent with neoplasm.  There was no mediastinal or hilar lymphadenopathy or metastatic disease.  We discussed a CT-guided biopsy of the right upper lobe nodule for diagnoses and likely referred to radiation oncology for stereotactic radiosurgery.  The patient was seen in consultation for CT-guided pulmonary nodule biopsy on/19/2016 by Dr. Sandi Mariscal of interventional radiology.  The risk and benefits of the procedure were discussed with the patient and his family. After prolonged discussion, he wished to postpone the procedure until after the holidays. He wished to have the opportunity of discussing with radiation oncology the expected outcome after treatment for a probable stage I lung cancer.  They were also interested in the possibility of radiation to the lesion without a  biopsy.  Past Medical History  Diagnosis Date  . Atrial fibrillation, unspecified   . Contusion of left hip   . Recurrent falls   . Muscle weakness (generalized)   . Atherosclerotic heart disease of native coronary artery without angina pectoris   . Hypertension   . Primary open angle glaucoma of left eye, severe stage   . Blindness of left eye   . COPD (chronic obstructive pulmonary disease) ([place])   . Hyperlipidemia   . Dysrhythmia     Past Surgical History  Procedure Laterality Date  . Cholecystectomy    . Transurethral resection of prostate    . Coronary angioplasty with stent placement    . Carotid stent      Family History  Problem Relation Age of  Onset  . Hypertension      Social History:  reports that he has quit smoking. He has never used smokeless tobacco. He reports that he drinks about 0.6 oz of alcohol per week. He reports that he does not use illicit drugs.  He resides at [facility] [place].  The patient is accompanied by his wife and daughter, Arville Go, today.  Allergies:  Allergies  Allergen Reactions  . Adenosine Other (See Comments)    Reaction:  Unknown   . Hydrochlorothiazide Other (See Comments)    Reaction:  Unknown   . Ivp Dye [Iodinated Diagnostic Agents] Other (See Comments)    Reaction:  Unknown   . Penicillins Other (See Comments)    Reaction:  Unknown   . Tramadol Other (See Comments)    Reaction:  Unknown     Current Medications: Current Outpatient Prescriptions  Medication Sig Dispense Refill  . aspirin EC 81 MG tablet Take 81 mg by mouth daily.    . bimatoprost (LUMIGAN) 0.01 % SOLN Place 1 drop into both eyes at bedtime.    . brimonidine (ALPHAGAN) 0.2 % ophthalmic solution Place 1 drop into the right eye 3 (three) times daily.    . cholecalciferol (VITAMIN D) 1000 UNITS tablet Take 1,000 Units by mouth daily.    . cloNIDine (CATAPRES) 0.1 MG tablet Take 0.1 mg by mouth once.    . dorzolamide-timolol (COSOPT) 22.3-6.8 MG/ML ophthalmic solution Place 1 drop into the right eye 2 (two) times daily.    Marland Kitchen  HYDROcodone-acetaminophen (NORCO/VICODIN) 5-325 MG tablet Take 1 tablet by mouth every 4 (four) hours as needed for moderate pain.    . isosorbide mononitrate (IMDUR) 30 MG 24 hr tablet Take 1 tablet (30 mg total) by mouth daily. 30 tablet 0  . lactulose (CHRONULAC) 10 GM/15ML solution Take 20 g by mouth 2 (two) times daily.    Marland Kitchen lidocaine (LIDODERM) 5 % Place 1 patch onto the skin daily. Remove & Discard patch within 12 hours or as directed by MD    . lisinopril (PRINIVIL,ZESTRIL) 20 MG tablet Take 20 mg by mouth daily.    . Menthol-Methyl Salicylate (THERA-GESIC) 1-15 % CREA Apply 1 application  topically 4 (four) times daily as needed (for pain).    . metoprolol succinate (TOPROL-XL) 100 MG 24 hr tablet Take 100 mg by mouth daily.    . simvastatin (ZOCOR) 20 MG tablet Take 20 mg by mouth at bedtime.     No current facility-administered medications for this visit.    Review of Systems:  GENERAL:  General fatigue.  Limited activity.  No fevers, sweats or weight loss. PERFORMANCE STATUS (ECOG):  3 HEENT:  No visual changes, runny nose, sore throat, mouth sores or tenderness. Lungs:  Intermittent shortness of breath.  No cough.  No hemoptysis. Cardiac:  No chest pain, palpitations, orthopnea, or PND. GI:  Diet fair.  No nausea, vomiting, diarrhea, constipation, melena or hematochezia. GU:  No urgency, frequency, dysuria, or hematuria. Musculoskeletal:  Chronic back back pain.  No joint pain.  No muscle tenderness. Extremities:  No pain or swelling. Skin:  No rashes or skin changes. Neuro:  No headache, numbness or weakness, balance or coordination issues. Endocrine:  No diabetes, thyroid issues, hot flashes or night sweats. Psych:  No mood changes, depression or anxiety. Pain:  No focal pain. Review of systems:  All other systems reviewed and found to be negative.  Physical Exam: Blood pressure 158/74, pulse 66, temperature 97.1 F (36.2 C), temperature source Tympanic, resp. rate 18, height 5\' 4"  (1.626 m), SpO2 95 %. GENERAL:  Well developed, well nourished, elderly gentleman sitting comfortably in a wheelchair in the exam room in no acute distress. MENTAL STATUS:  Alert and oriented to person, place and time. HEAD:  Short gray hair.  Male pattern baldness.  Normocephalic, atraumatic, face symmetric, no Cushingoid features. EYES:  Blue eyes.  No conjunctivitis or scleral icterus. RESPIRATORY:  Poor respiratory excursion.  Clear to auscultation without rales, wheezes or rhonchi. CARDIOVASCULAR:  Regular rate and rhythm without murmur, rub or gallop. SKIN:  No rashes, ulcers or  lesions. EXTREMITIES: No edema, no skin discoloration or tenderness.  No palpable cords. NEUROLOGICAL: Unremarkable. PSYCH:  Appropriate.  No visits with results within 3 Day(s) from this visit. Latest known visit with results is:  Hospital Outpatient Visit on 06/26/2015  Component Date Value Ref Range Status  . aPTT 06/26/2015 31  24 - 36 seconds Final  . WBC 06/26/2015 8.2  3.8 - 10.6 K/uL Final  . RBC 06/26/2015 4.94  4.40 - 5.90 MIL/uL Final  . Hemoglobin 06/26/2015 14.9  13.0 - 18.0 g/dL Final  . HCT 06/26/2015 46.6  40.0 - 52.0 % Final  . MCV 06/26/2015 94.4  80.0 - 100.0 fL Final  . MCH 06/26/2015 30.2  26.0 - 34.0 pg Final  . MCHC 06/26/2015 32.0  32.0 - 36.0 g/dL Final  . RDW 06/26/2015 16.5* 11.5 - 14.5 % Final  . Platelets 06/26/2015 177  150 - 440 K/uL Final  .  Prothrombin Time 06/26/2015 15.3* 11.4 - 15.0 seconds Final  . INR 06/26/2015 1.19   Final    Assessment:  Mason Allen is a 81 y.o. male with probable early stage lung cancer. He has a 30 pack year smoking history, chronic shortness of breath, and a 20 pound weight loss. Initial imaging studies on 05/29/2015 revealed small to moderate bilateral pleural effusions (right greater than left) and a 2.1 cm nodule near the right lung apex worrisome for primary bronchogenic carcinoma. There was a 3.3 cm solid mass inferior to the splenic hilum.  Right sided thoracentesis on 05/30/2015 revealed no evidence of malignancy. Cultures were negative.  PET scan on 06/15/2015 revealed a 1.6 cm hypermetabolic right upper lobe pulmonary nodule consistent with neoplasm. There was no mediastinal or hilar lymphadenopathy or evidence of metastatic disease.  The patient declined CT guided biopsy on 06/26/2015.  He is interested in talking to radiation oncology and possibly receiving treatment without a biopsy.  Symptomatically, his performance status is poor (ECOG 3). He is currently residing at the [facility].  Plan: 1.  Review conversation with interventional radiology.  The patient is not interested in resection or chemotherapy.  Discuss referral to radiation oncology.  Doubt he will be able to receive radiation without a biopsy.  Side effects of radiation were reviewed.  Risks and benefits of radiation were discussed.  Overall prognosis regarding presumed early stage non-small cell lung cancer discussed.  Patient has other significant co-morbidities. 2.  Consult radiation oncology. 3.  RTC after radiation oncology consult.   Lequita Asal, MD  06/29/2015, 5:07 PM

## 2015-07-09 ENCOUNTER — Encounter
Admission: RE | Admit: 2015-07-09 | Discharge: 2015-07-09 | Disposition: A | Discharge status: Home / Self Care | Payer: Medicare Other | Source: Ambulatory Visit | Attending: Internal Medicine | Admitting: Internal Medicine

## 2015-07-09 DIAGNOSIS — R5383 Other fatigue: Secondary | ICD-10-CM | POA: Insufficient documentation

## 2015-07-09 DIAGNOSIS — R410 Disorientation, unspecified: Secondary | ICD-10-CM | POA: Insufficient documentation

## 2015-07-14 ENCOUNTER — Ambulatory Visit
Admission: RE | Admit: 2015-07-14 | Discharge: 2015-07-14 | Disposition: A | Discharge status: Home / Self Care | Payer: Medicare Other | Source: Ambulatory Visit | Attending: Radiation Oncology | Admitting: Radiation Oncology

## 2015-07-14 ENCOUNTER — Other Ambulatory Visit: Payer: Self-pay | Admitting: *Deleted

## 2015-07-14 ENCOUNTER — Encounter: Payer: Self-pay | Admitting: Radiation Oncology

## 2015-07-14 VITALS — BP 139/74 | HR 99 | Temp 96.6°F

## 2015-07-14 DIAGNOSIS — D381 Neoplasm of uncertain behavior of trachea, bronchus and lung: Secondary | ICD-10-CM

## 2015-07-14 NOTE — Consult Note (Signed)
Except an outstanding is perfect of Radiation Oncology NEW PATIENT EVALUATION  Name: Mason Allen  MRN: [id number]  Date:   07/14/2015     DOB: 04-16-1921   This 82 y.o. male patient presents to the clinic for initial evaluation of probable T1 right upper lobe non-small cell lung cancer.  REFERRING PHYSICIAN: Kirk Ruths, MD  CHIEF COMPLAINT:  Chief Complaint  Patient presents with  . Lung Cancer    Initial visit regarding radiation treatments    DIAGNOSIS: The encounter diagnosis was Neoplasm of uncertain behavior of right upper lobe of lung.   PREVIOUS INVESTIGATIONS:  PET CT scan and CT scans reviewed Clinical notes reviewed  HPI: Patient is a 82 year old male with multiple comorbidities including atrial fibrillation generalized muscle weakness COPD hyperlipidemia and dysrhythmia. He also has known pleural effusions. He was being followed for increased shortness of breath atrial fibrillation admitted through the emergency room and chest x-ray revealed a small right upper lobe lung nodule with bilateral pleural effusions. He had thoracentesis with cytology negative. Patient was scheduled for CT-guided biopsy although radiology declined based on the patient's overall general condition and risk benefit profile. I am seeing the patient today for consideration of the presumed non-small cell lung cancer. PET CT scan demonstrated SUV of 3.8 in the 1.6 mm right upper lobe pulmonary nodule. This time patient from [facility] standpoint is on nasal oxygen. He is quite lethargic. He is accompanied today by his wife and daughter.  PLANNED TREATMENT REGIMEN: Observation  PAST MEDICAL HISTORY:  has a past medical history of Atrial fibrillation, unspecified; Contusion of left hip; Recurrent falls; Muscle weakness (generalized); Atherosclerotic heart disease of native coronary artery without angina pectoris; Hypertension; Primary open angle glaucoma of left eye, severe stage; Blindness of left eye;  COPD (chronic obstructive pulmonary disease) ([place]); Hyperlipidemia; and Dysrhythmia.    PAST SURGICAL HISTORY:  Past Surgical History  Procedure Laterality Date  . Cholecystectomy    . Transurethral resection of prostate    . Coronary angioplasty with stent placement    . Carotid stent      FAMILY HISTORY: family history is not on file.  SOCIAL HISTORY:  reports that he has quit smoking. He has never used smokeless tobacco. He reports that he drinks about 0.6 oz of alcohol per week. He reports that he does not use illicit drugs.  ALLERGIES: Adenosine; Hydrochlorothiazide; Ivp dye; Penicillins; and Tramadol  MEDICATIONS:  Current Outpatient Prescriptions  Medication Sig Dispense Refill  . aspirin EC 81 MG tablet Take 81 mg by mouth daily.    . bimatoprost (LUMIGAN) 0.01 % SOLN Place 1 drop into both eyes at bedtime.    . brimonidine (ALPHAGAN) 0.2 % ophthalmic solution Place 1 drop into the right eye 3 (three) times daily.    . cholecalciferol (VITAMIN D) 1000 UNITS tablet Take 1,000 Units by mouth daily.    . cloNIDine (CATAPRES) 0.1 MG tablet Take 0.1 mg by mouth once.    . dorzolamide-timolol (COSOPT) 22.3-6.8 MG/ML ophthalmic solution Place 1 drop into the right eye 2 (two) times daily.    Marland Kitchen HYDROcodone-acetaminophen (NORCO/VICODIN) 5-325 MG tablet Take 1 tablet by mouth every 4 (four) hours as needed for moderate pain.    . isosorbide mononitrate (IMDUR) 30 MG 24 hr tablet Take 1 tablet (30 mg total) by mouth daily. 30 tablet 0  . lactulose (CHRONULAC) 10 GM/15ML solution Take 20 g by mouth 2 (two) times daily.    Marland Kitchen lidocaine (LIDODERM) 5 % Place 1  patch onto the skin daily. Remove & Discard patch within 12 hours or as directed by MD    . lisinopril (PRINIVIL,ZESTRIL) 20 MG tablet Take 20 mg by mouth daily.    . Menthol-Methyl Salicylate (THERA-GESIC) 1-15 % CREA Apply 1 application topically 4 (four) times daily as needed (for pain).    . metoprolol succinate (TOPROL-XL) 100 MG  24 hr tablet Take 100 mg by mouth daily.    . simvastatin (ZOCOR) 20 MG tablet Take 20 mg by mouth at bedtime.     No current facility-administered medications for this encounter.    ECOG PERFORMANCE STATUS:  2 - Symptomatic, <50% confined to bed  REVIEW OF SYSTEMS:  Patient denies any weight loss, fatigue, weakness, fever, chills or night sweats. Patient denies any loss of vision, blurred vision. Patient denies any ringing  of the ears or hearing loss. No irregular heartbeat. Patient denies heart murmur or history of fainting. Patient denies any chest pain or pain radiating to her upper extremities. Patient denies any shortness of breath, difficulty breathing at night, cough or hemoptysis. Patient denies any swelling in the lower legs. Patient denies any nausea vomiting, vomiting of blood, or coffee ground material in the vomitus. Patient denies any stomach pain. Patient states has had normal bowel movements no significant constipation or diarrhea. Patient denies any dysuria, hematuria or significant nocturia. Patient denies any problems walking, swelling in the joints or loss of balance. Patient denies any skin changes, loss of hair or loss of weight. Patient denies any excessive worrying or anxiety or significant depression. Patient denies any problems with insomnia. Patient denies excessive thirst, polyuria, polydipsia. Patient denies any swollen glands, patient denies easy bruising or easy bleeding. Patient denies any recent infections, allergies or URI. Patient "s visual fields have not changed significantly in recent time.    PHYSICAL EXAM: BP 139/74 mmHg  Pulse 99  Temp(Src) 96.6 F (35.9 C) Elderly frail male wheelchair-bound on nasal oxygen in NAD. No cervical or supra clavicular adenopathy is identified. Well-developed well-nourished patient in NAD. HEENT reveals PERLA, EOMI, discs not visualized.  Oral cavity is clear. No oral mucosal lesions are identified. Neck is clear without  evidence of cervical or supraclavicular adenopathy. Lungs are clear to A&P. Cardiac examination is essentially unremarkable with regular rate and rhythm without murmur rub or thrill. Abdomen is benign with no organomegaly or masses noted. Motor sensory and DTR levels are equal and symmetric in the upper and lower extremities. Cranial nerves II through XII are grossly intact. Proprioception is intact. No peripheral adenopathy or edema is identified. No motor or sensory levels are noted. Crude visual fields are within normal range.   LABORATORY DATA: No attempted needle biopsy will be made at this time    RADIOLOGY RESULTS: CT scan PET CT scans are reviewed compatible with the above-stated findings   IMPRESSION: Probable T1 stage I non-small cell lung cancer in right upper lobe  PLAN: At this time based on the low hypermetabolic activity and small size of the lesion would opt to continue to observe this lesion and repeat CT scan in 4 months. Should there be appreciable growth in the lesion may opt at that time to undergo SB RT treatment. Again based on the patient's overall general condition and age and comorbidities needle biopsy may be significant morbidity. I've explained this to the family and they all seem to comprehend my treatment plan well. I've ordered a four-month follow-up with myself in a CT scan with contrast 1  week prior to that appointment. Should the family become too concerned about treating the cancer at this time they will call my office and we will have that discussion.  I would like to take this opportunity for allowing me to participate in the care of your patient.Armstead Peaks., MD

## 2015-07-17 ENCOUNTER — Inpatient Hospital Stay: Payer: Medicare Other | Admitting: Hematology and Oncology

## 2015-07-26 DIAGNOSIS — R5383 Other fatigue: Secondary | ICD-10-CM | POA: Diagnosis present

## 2015-07-26 DIAGNOSIS — R410 Disorientation, unspecified: Secondary | ICD-10-CM | POA: Diagnosis present

## 2015-07-26 LAB — URINALYSIS COMPLETE WITH MICROSCOPIC (ARMC ONLY)
BACTERIA UA: NONE SEEN
Bilirubin Urine: NEGATIVE
Glucose, UA: NEGATIVE mg/dL
HGB URINE DIPSTICK: NEGATIVE
Ketones, ur: NEGATIVE mg/dL
LEUKOCYTES UA: NEGATIVE
Nitrite: NEGATIVE
PH: 5 (ref 5.0–8.0)
Protein, ur: 100 mg/dL — AB
SQUAMOUS EPITHELIAL / LPF: NONE SEEN
Specific Gravity, Urine: 1.024 (ref 1.005–1.030)

## 2015-07-28 LAB — URINE CULTURE

## 2015-08-09 DEATH — deceased

## 2015-08-17 ENCOUNTER — Encounter: Payer: Self-pay | Admitting: Hematology and Oncology

## 2015-11-23 ENCOUNTER — Ambulatory Visit: Payer: Medicare Other

## 2015-11-30 ENCOUNTER — Ambulatory Visit: Payer: Self-pay | Admitting: Radiation Oncology

## 2015-11-30 ENCOUNTER — Ambulatory Visit: Payer: Medicare Other | Admitting: Radiation Oncology

## 2015-11-30 ENCOUNTER — Inpatient Hospital Stay: Admission: RE | Admit: 2015-11-30 | Payer: Medicare Other | Source: Ambulatory Visit | Admitting: Radiation Oncology

## 2016-09-25 IMAGING — CT NM PET TUM IMG INITIAL (PI) SKULL BASE T - THIGH
8 series · 25 of 25 positions shown · non-contrast
Comparison: Chest CT 05/29/2015

CLINICAL DATA: Initial Treatment strategy for right upper lobe
pulmonary nodule.

EXAM:
NUCLEAR MEDICINE PET SKULL BASE TO THIGH
TECHNIQUE: 12.46 mCi F-18 FDG was injected intravenously. Full-ring PET imaging
was performed from the skull base to thigh after the radiotracer. CT
data was obtained and used for attenuation correction and anatomic
localization.
FASTING BLOOD GLUCOSE:  Value: 85 mg/dl

[Series 3: ct wb 5.0 b30f · axial · 5.0mm · 0.98mm/px · z∈[-1540,-672]mm · 5 of 290 slices shown]
[im 1/290]
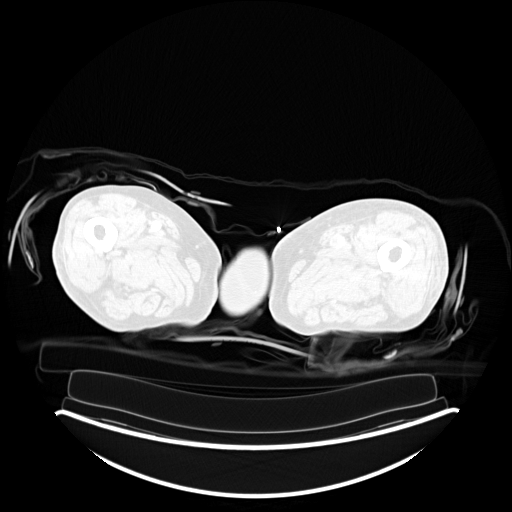
[im 73/290]
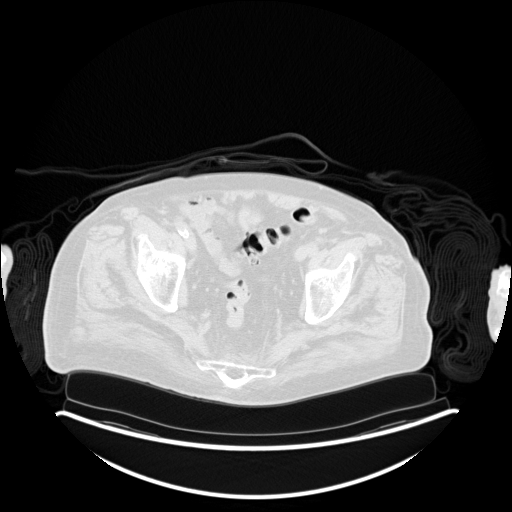
[im 145/290]
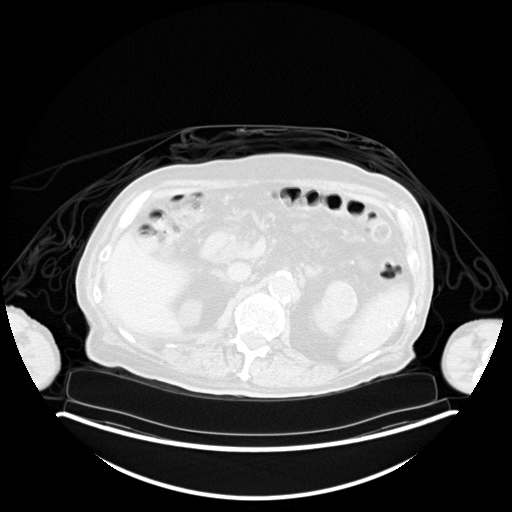
[im 217/290]
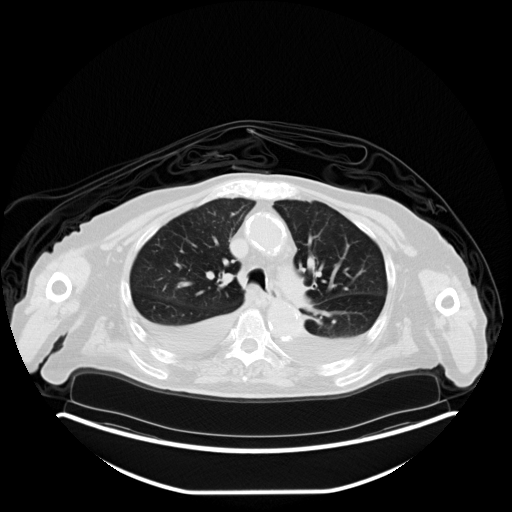
[im 290/290]
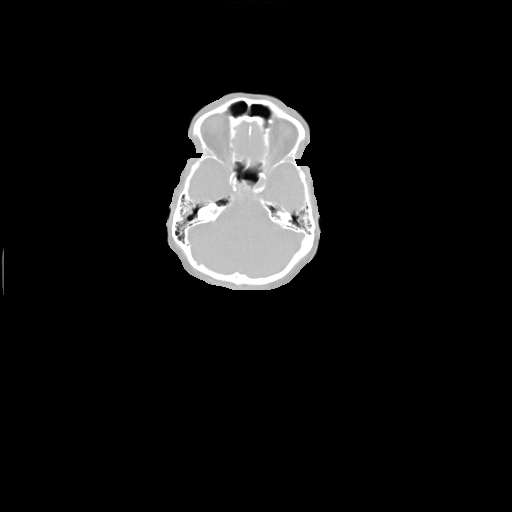

[Series 4: pet wb (ac) · axial · 5.0mm · 4.07mm/px · z∈[-1540,-672]mm · 5 of 290 slices shown]
[im 1/290]
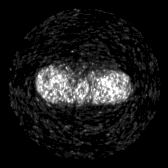
[im 73/290]
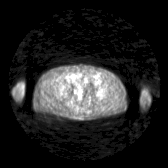
[im 145/290]
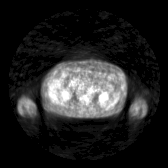
[im 217/290]
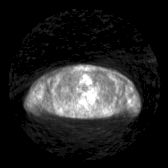
[im 290/290]
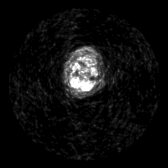

[Series 5: pet wb uncorrected (nac) · axial · 5.0mm · 4.07mm/px · z∈[-1540,-672]mm · 5 of 290 slices shown]
[im 1/290]
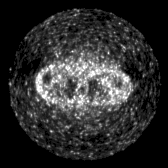
[im 73/290]
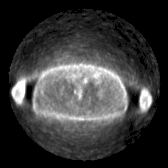
[im 145/290]
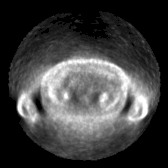
[im 217/290]
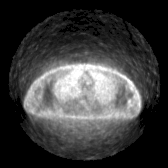
[im 290/290]
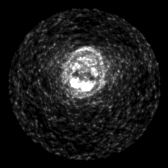

[Series 603: pet axial · 4 of 263 slices shown]
[im 1/263]
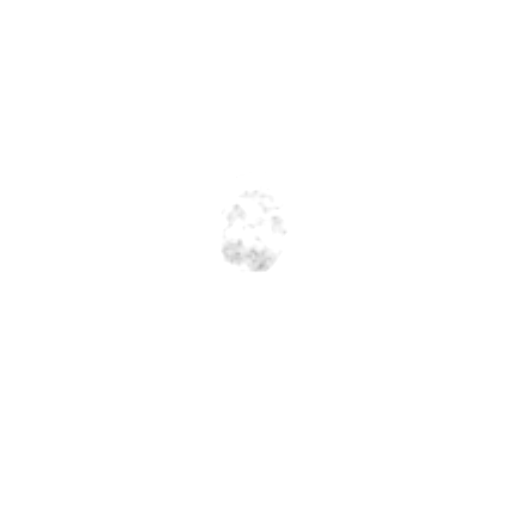
[im 88/263]
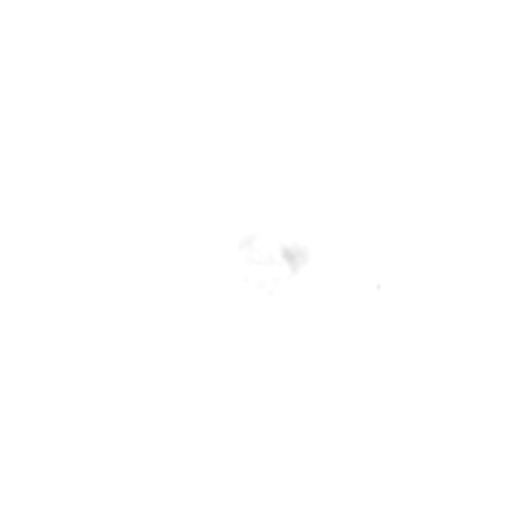
[im 175/263]
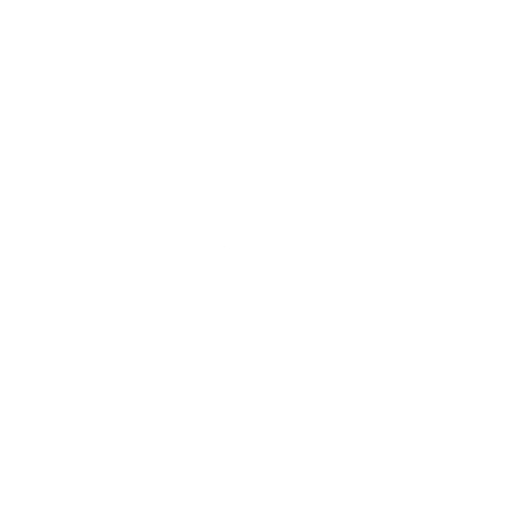
[im 263/263]
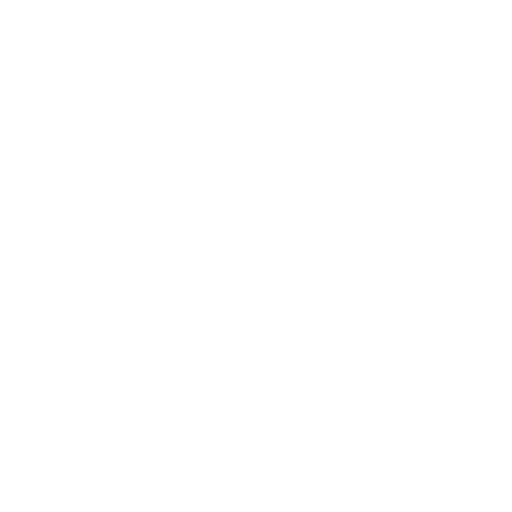

[Series 605: pet sagittal · 2 of 119 slices shown]
[im 1/119]
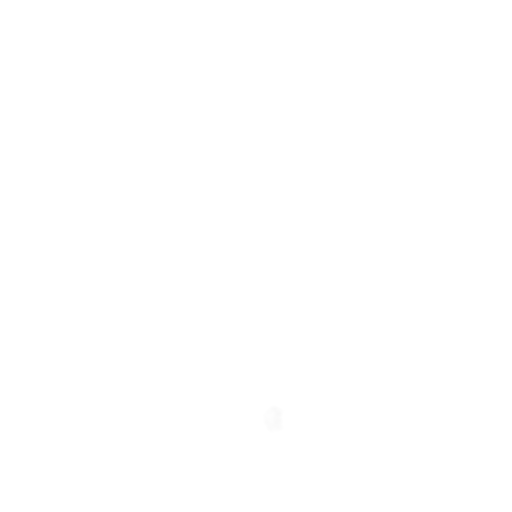
[im 119/119]
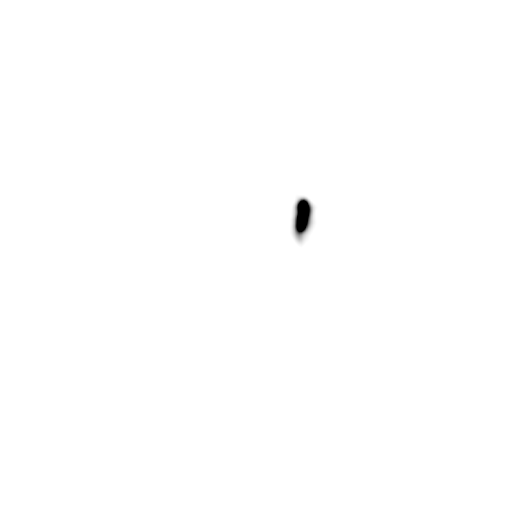

[Series 608: pet ct sagittal · 2 of 142 slices shown]
[im 1/142]
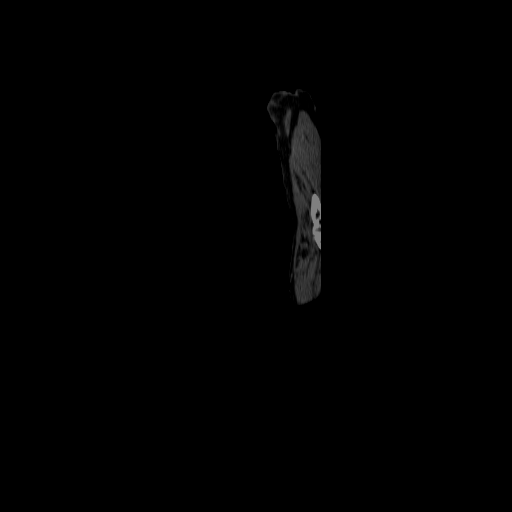
[im 142/142]
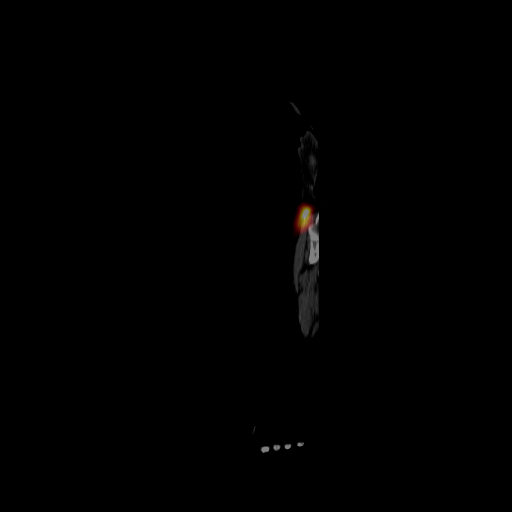

[Series 609: pet coronal 1 · 1 of 87 slices shown]
[im 1/87]
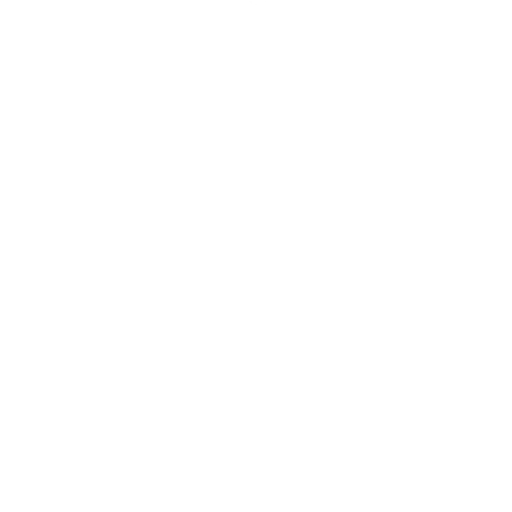

[Series 1038: results mm oncology reading · 0.89mm/px · 1 of 1 slices shown]
[im 1/1]
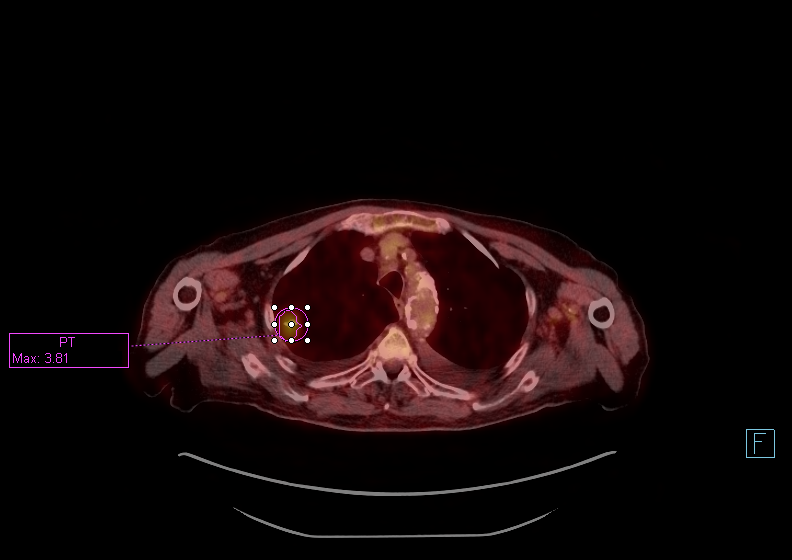

[25 of 25 positions shown; findings below may reference images not displayed]

FINDINGS: NECK

No hypermetabolic lymph nodes in the neck.

CHEST

The 16 mm right upper lobe pulmonary nodule is hypermetabolic with
SUV max of 3.8. This is consistent with primary lung neoplasm. No
hypermetabolic mediastinal or hilar lymph nodes.

Benign calcified granuloma in the right middle lobe.

Extensive atherosclerotic calcification involving the thoracic aorta
and coronary arteries.

Bilateral pleural effusions and overlying atelectasis.

ABDOMEN/PELVIS

No abnormal hypermetabolic activity within the liver, pancreas,
adrenal glands, or spleen. No hypermetabolic lymph nodes in the
abdomen or pelvis.

Stable calcified granulomas in the spleen and small hepatic cysts.

Bilateral renal calculi and renal cysts. Upper pole left renal cysts
is likely complicated by hemorrhage measuring 22 Hounsfield units.
Small lower pole left renal lesion projecting medially measures 48
Hounsfield units and is most likely a hemorrhagic cyst.

Advanced atherosclerotic calcifications involving the aorta and
branch vessels. Infrarenal abdominal aortic aneurysm measuring a
maximum of 2.95 cm

SKELETON

No findings to suggest metastatic bone disease. Right-sided
trochanteric bursitis is noted. Extravasation at the injection site
in the left antecubital fossa is noted.
IMPRESSION: 1. Hypermetabolic 16 mm right upper lobe pulmonary nodule consistent
with neoplasm. No mediastinal or hilar lymphadenopathy or metastatic
disease.
2. Persistent bilateral pleural effusions and overlying atelectasis.
3. Advanced atherosclerotic calcifications involving the aorta and
branch vessels.
4. Probable complex cysts associated with the left kidney. Recommend
attention on future scans.

## 2021-07-20 NOTE — Telephone Encounter (Signed)
error
